# Patient Record
Sex: Female | Born: 1945 | Race: Black or African American | Hispanic: No | Marital: Married | Smoking: Never smoker
Health system: Southern US, Community
[De-identification: ages and names within clinical notes are randomized; demographics above are authoritative.]

## PROBLEM LIST (undated history)

## (undated) DIAGNOSIS — H269 Unspecified cataract: Secondary | ICD-10-CM

## (undated) DIAGNOSIS — M810 Age-related osteoporosis without current pathological fracture: Secondary | ICD-10-CM

## (undated) DIAGNOSIS — D649 Anemia, unspecified: Secondary | ICD-10-CM

## (undated) DIAGNOSIS — M1990 Unspecified osteoarthritis, unspecified site: Secondary | ICD-10-CM

## (undated) DIAGNOSIS — E785 Hyperlipidemia, unspecified: Secondary | ICD-10-CM

## (undated) DIAGNOSIS — I10 Essential (primary) hypertension: Secondary | ICD-10-CM

## (undated) HISTORY — PX: ROTATOR CUFF REPAIR: SHX139

## (undated) HISTORY — DX: Unspecified osteoarthritis, unspecified site: M19.90

## (undated) HISTORY — DX: Essential (primary) hypertension: I10

## (undated) HISTORY — DX: Anemia, unspecified: D64.9

## (undated) HISTORY — DX: Hyperlipidemia, unspecified: E78.5

## (undated) HISTORY — PX: COLONOSCOPY: SHX174

## (undated) HISTORY — PX: POLYPECTOMY: SHX149

## (undated) HISTORY — PX: CATARACT EXTRACTION: SUR2

## (undated) HISTORY — DX: Unspecified cataract: H26.9

## (undated) HISTORY — DX: Age-related osteoporosis without current pathological fracture: M81.0

---

## 2012-05-15 ENCOUNTER — Encounter: Payer: Self-pay | Admitting: Family Medicine

## 2012-05-15 ENCOUNTER — Ambulatory Visit (INDEPENDENT_AMBULATORY_CARE_PROVIDER_SITE_OTHER): Payer: BC Managed Care – PPO | Admitting: Family Medicine

## 2012-05-15 VITALS — BP 158/93 | HR 63 | Ht 63.0 in | Wt 185.0 lb

## 2012-05-15 DIAGNOSIS — I10 Essential (primary) hypertension: Secondary | ICD-10-CM | POA: Insufficient documentation

## 2012-05-15 DIAGNOSIS — Z23 Encounter for immunization: Secondary | ICD-10-CM

## 2012-05-15 DIAGNOSIS — R030 Elevated blood-pressure reading, without diagnosis of hypertension: Secondary | ICD-10-CM

## 2012-05-15 NOTE — Progress Notes (Signed)
  Subjective:    Patient ID: Claudia [name], female    DOB: 10-Oct-1945, 66 y.o.   MRN: [id number]  HPI History was obtained. Patient's here with only one significant medical concern which is her elevated blood pressure. She has been on lisinopril before when her blood pressure went significantly lower she stopped it about her blood pressure to be fine. Blood pressure is up today but this is the first time to this office and first exposures to me.   Review of Systems  All other systems reviewed and are negative.      BP 158/93  Pulse 63  Ht 5\' 3"  (1.6 m)  Wt 185 lb (83.915 kg)  BMI 32.77 kg/m2  SpO2 94% Objective:   Physical Exam  Vitals reviewed. Constitutional: She is oriented to person, place, and time. She appears well-developed.  HENT:  Head: Normocephalic.  Cardiovascular: Normal rate and regular rhythm.   Pulmonary/Chest: Effort normal and breath sounds normal.  Neurological: She is alert and oriented to person, place, and time.  Skin: Skin is warm.  Psychiatric: She has a normal mood and affect. Her behavior is normal.     2nd set of  Assessment & Plan:  #1 elevated blood pressure. We'll recheck her blood pressure before she leaves today. Since her blood pressure has been normal she has been off her lisinopril I am suspicious that this may be white coat hypertension. I've asked her to keep a log of her blood pressure taking it weekly if possible and bring it in to review. Also we will recheck her blood pressure while she is still here today.  #2 flu vaccination. Patient needs flu injection will minister today.  Return in 8 weeks to 10 weeks for complete physical.

## 2012-05-15 NOTE — Patient Instructions (Addendum)
Hypertension As your heart beats, it forces blood through your arteries. This force is your blood pressure. If the pressure is too high, it is called hypertension (HTN) or high blood pressure. HTN is dangerous because you may have it and not know it. High blood pressure may mean that your heart has to work harder to pump blood. Your arteries may be narrow or stiff. The extra work puts you at risk for heart disease, stroke, and other problems.  Blood pressure consists of two numbers, a higher number over a lower, 110/72, for example. It is stated as "110 over 72." The ideal is below 120 for the top number (systolic) and under 80 for the bottom (diastolic). Write down your blood pressure today. You should pay close attention to your blood pressure if you have certain conditions such as:  Heart failure.   Prior heart attack.   Diabetes   Chronic kidney disease.   Prior stroke.   Multiple risk factors for heart disease.  To see if you have HTN, your blood pressure should be measured while you are seated with your arm held at the level of the heart. It should be measured at least twice. A one-time elevated blood pressure reading (especially in the Emergency Department) does not mean that you need treatment. There may be conditions in which the blood pressure is different between your right and left arms. It is important to see your caregiver soon for a recheck. Most people have essential hypertension which means that there is not a specific cause. This type of high blood pressure may be lowered by changing lifestyle factors such as:  Stress.   Smoking.   Lack of exercise.   Excessive weight.   Drug/tobacco/alcohol use.   Eating less salt.  Most people do not have symptoms from high blood pressure until it has caused damage to the body. Effective treatment can often prevent, delay or reduce that damage. TREATMENT  When a cause has been identified, treatment for high blood pressure is  directed at the cause. There are a large number of medications to treat HTN. These fall into several categories, and your caregiver will help you select the medicines that are best for you. Medications may have side effects. You should review side effects with your caregiver. If your blood pressure stays high after you have made lifestyle changes or started on medicines,   Your medication(s) may need to be changed.   Other problems may need to be addressed.   Be certain you understand your prescriptions, and know how and when to take your medicine.   Be sure to follow up with your caregiver within the time frame advised (usually within two weeks) to have your blood pressure rechecked and to review your medications.   If you are taking more than one medicine to lower your blood pressure, make sure you know how and at what times they should be taken. Taking two medicines at the same time can result in blood pressure that is too low.  SEEK IMMEDIATE MEDICAL CARE IF:  You develop a severe headache, blurred or changing vision, or confusion.   You have unusual weakness or numbness, or a faint feeling.   You have severe chest or abdominal pain, vomiting, or breathing problems.  MAKE SURE YOU:   Understand these instructions.   Will watch your condition.   Will get help right away if you are not doing well or get worse.  Document Released: 08/15/2005 Document Revised: 08/04/2011 Document Reviewed:   04/04/2008 ExitCare Patient Information 2012 [place], [place].Intranasal H1N1 Influenza (swine flu) Vaccine What is this medicine? INTRANASAL H1N1 INFLUENZA (SWINE FLU) VACCINE (in truh NEY zuhl H1N1 in floo EN zuh (swahyn floo) vak SEEN) helps reduce the risk of getting the pandemic H1N1 flu also known as the swine flu. The vaccine only helps protect you against this one strain of the flu. This vaccine does not help to the reduce the risk of getting other types of flu. You may also need to get the  seasonal influenza virus vaccine. This medicine may be used for other purposes; ask your health care provider or pharmacist if you have questions. What should I tell my health care provider before I take this medicine? They need to know if you have any of these conditions: -asthma or wheezing -Guillain-Barre syndrome -immune system problems -under 18 and taking aspirin -an unusual or allergic reaction to intranasal influenza vaccine, eggs, gentamicin, gelatin, arginine, other medicines, foods, dyes or preservatives -pregnant or trying to get pregnant -breast-feeding How should I use this medicine? This vaccine is for use in the nose. It is given by a health care professional. A copy of Vaccine Information Statements will be given before each vaccination. Read this sheet carefully each time. The sheet may change frequently. Talk to your pediatrician regarding the use of this medicine in children. While this drug may be prescribed for children as young as 2 years for selected conditions, precautions do apply. Overdosage: If you think you've taken too much of this medicine contact a poison control center or emergency room at once. Overdosage: If you think you have taken too much of this medicine contact a poison control center or emergency room at once. NOTE: This medicine is only for you. Do not share this medicine with others. What if I miss a dose? If needed, keep appointments for follow-up (booster) doses as directed. It is important not to miss your dose. Call your doctor or health care professional if you are unable to keep an appointment. What may interact with this medicine? Do not take this medicine with any of the following medications: -anakinra -rilonacept -tumor necrosis factor (TNF) modifiers like adalimumab, etanercept, infliximab, golimumab, or certolizumab This medicine may also interact with the following medications: -aspirin and aspirin-like medicines -medicines for organ  transplant -medicines to treat cancer -medicines to treat the flu -other vaccines -steroid medicines like prednisone or cortisone This list may not describe all possible interactions. Give your health care provider a list of all the medicines, herbs, non-prescription drugs, or dietary supplements you use. Also tell them if you smoke, drink alcohol, or use illegal drugs. Some items may interact with your medicine. What should I watch for while using this medicine? Report any side effects to your doctor right away. After receiving this vaccine, stay away from people who have severe immune system problems for 7 days. You may give them the flu. This vaccine lowers your risk of getting the pandemic H1N1 flu. You can get a milder H1N1 flu infection if you are around others with this flu. This flu vaccine will not protect against colds or other illnesses including other flu viruses. You may also need the seasonal influenza vaccine. What side effects may I notice from receiving this medicine? Side effects that you should report to your doctor or health care professional as soon as possible: -allergic reactions like skin rash, itching or hives, swelling of the face, lips, or tongue -breathing problems -muscle weakness -unusual drooping or paralysis of  face Side effects that usually do not require medical attention (report to your doctor or health care professional if they continue or are bothersome): -chills -cough -headache -muscle aches and pains -runny or stuffy nose -sore throat -stomach upset -tiredness This list may not describe all possible side effects. Call your doctor for medical advice about side effects. You may report side effects to FDA at 1-800-FDA-1088. Where should I keep my medicine? This vaccine is only given in a clinic, pharmacy, doctor's office, or other health care setting and will not be stored at home. NOTE: This sheet is a summary. It may not cover all possible  information. If you have questions about this medicine, talk to your doctor, pharmacist, or health care provider.  2012, [employer]. (07/15/2008 4:54:09 PM)

## 2012-07-24 ENCOUNTER — Encounter: Payer: Self-pay | Admitting: Family Medicine

## 2012-07-24 ENCOUNTER — Ambulatory Visit (INDEPENDENT_AMBULATORY_CARE_PROVIDER_SITE_OTHER): Payer: BC Managed Care – PPO | Admitting: Family Medicine

## 2012-07-24 VITALS — BP 145/83 | HR 70 | Ht 63.0 in | Wt 185.0 lb

## 2012-07-24 DIAGNOSIS — R590 Localized enlarged lymph nodes: Secondary | ICD-10-CM

## 2012-07-24 DIAGNOSIS — R599 Enlarged lymph nodes, unspecified: Secondary | ICD-10-CM

## 2012-07-24 DIAGNOSIS — IMO0001 Reserved for inherently not codable concepts without codable children: Secondary | ICD-10-CM

## 2012-07-24 DIAGNOSIS — Z1211 Encounter for screening for malignant neoplasm of colon: Secondary | ICD-10-CM

## 2012-07-24 DIAGNOSIS — Z0000 Encounter for general adult medical examination without abnormal findings: Secondary | ICD-10-CM

## 2012-07-24 DIAGNOSIS — R030 Elevated blood-pressure reading, without diagnosis of hypertension: Secondary | ICD-10-CM

## 2012-07-24 LAB — POCT URINALYSIS DIPSTICK
Bilirubin, UA: NEGATIVE
Glucose, UA: NEGATIVE
Leukocytes, UA: NEGATIVE
Nitrite, UA: NEGATIVE

## 2012-07-24 MED ORDER — MINOCYCLINE HCL 100 MG PO CAPS: 100.0000 mg | ORAL_CAPSULE | Freq: Two times a day (BID) | ORAL | Status: DC

## 2012-07-24 NOTE — Progress Notes (Signed)
Subjective:    Patient ID: Claudia [name], female    DOB: 05/09/1946, 66 y.o.   MRN: [id number]  HPI  Yearly exam.  Review of Systems  HENT:       Patient also reports that her adenoids appear swollen and she has noticed increase snoring at night. She is unsure when or how long her symptoms have been going on.  All other systems reviewed and are negative.   No Known Allergies History   Social History  . Marital Status: Married    Spouse Name: N/A    Number of Children: N/A  . Years of Education: N/A   Occupational History  . Not on file.   Social History Main Topics  . Smoking status: Never Smoker   . Smokeless tobacco: Never Used  . Alcohol Use: No  . Drug Use: No  . Sexually Active: Yes    Birth Control/ Protection: Post-menopausal   Other Topics Concern  . Not on file   Social History Narrative  . No narrative on file   Family History  Problem Relation Age of Onset  . Hypertension Mother   . Stroke Father    History reviewed. No pertinent past medical history. Past Surgical History  Procedure Date  . Cataract extraction   . Rotator cuff repair     right      BP 145/83  Pulse 70  Ht 5\' 3"  (1.6 m)  Wt 185 lb (83.915 kg)  BMI 32.77 kg/m2 Objective:   Physical Exam  Vitals reviewed. Constitutional: She is oriented to person, place, and time. She appears well-developed and well-nourished.  HENT:  Head: Normocephalic and atraumatic.  Right Ear: Hearing, tympanic membrane and external ear normal.  Left Ear: Hearing, tympanic membrane and external ear normal.  Nose: No mucosal edema or rhinorrhea.  Mouth/Throat: She does not have dentures. Normal dentition. No lacerations or dental caries.       Bilateral adenopathy present, but thyroid gland WNL on palpation.  Eyes: Conjunctivae normal are normal. Pupils are equal, round, and reactive to light. Right eye exhibits no discharge. Left eye exhibits no discharge.  Fundoscopic exam:      The right eye shows no  arteriolar narrowing, no AV nicking and no exudate.       The left eye shows no red reflex. Neck: Trachea normal and normal range of motion. Neck supple. No JVD present. Carotid bruit is not present. No tracheal deviation present. No mass and no thyromegaly present.       Bilateral adenopathy was present.  Cardiovascular: Normal rate, regular rhythm and normal heart sounds.  Exam reveals no gallop.   No murmur heard. Pulmonary/Chest: Effort normal and breath sounds normal. No respiratory distress.  Abdominal: Soft. Bowel sounds are normal. She exhibits no distension. There is no hepatosplenomegaly, splenomegaly or hepatomegaly. There is no tenderness. There is no rebound and no CVA tenderness. No hernia. Hernia confirmed negative in the ventral area, confirmed negative in the right inguinal area and confirmed negative in the left inguinal area.  Genitourinary: Rectum normal, vagina normal and uterus normal. Rectal exam shows no fissure, no mass, no tenderness and anal tone normal. Guaiac negative stool. No breast swelling, tenderness, discharge or bleeding. There is no rash, tenderness or lesion on the right labia. There is no rash, tenderness or lesion on the left labia. Cervix exhibits no motion tenderness and no discharge. Right adnexum displays no mass and no tenderness. Left adnexum displays no mass and no tenderness.  Musculoskeletal: Normal range of motion. She exhibits no edema and no tenderness.  Lymphadenopathy:    She has cervical adenopathy.  Neurological: She is alert and oriented to person, place, and time. She has normal reflexes.  Skin: Skin is warm and dry.       Several keloids were present.  Psychiatric: She has a normal mood and affect. Her behavior is normal. Thought content normal.    EKG WNL  Results for orders placed in visit on 07/24/12  POCT URINALYSIS DIPSTICK      Component Value Range   Color, UA yellow     Clarity, UA clear     Glucose, UA neg     Bilirubin, UA  neg     Ketones, UA neg     Spec Grav, UA 1.020     Blood, UA small     pH, UA 7.0     Protein, UA neg     Urobilinogen, UA 0.2     Nitrite, UA neg     Leukocytes, UA Negative        Assessment & Plan:  #1 Health maintenance. Will obtain mammogram bone density and routine lab work.  #2 elevated blood pressure we'll continue to watch this may need to consider medication in the future.  #3 adenopathy. Etiology is not clear. We'll check white count also placed on doxycycline/minocycline 100 mg one tablet twice a day for 10 days. If lymph nodes and not improve at a white count of 20,000 within make an ENT referral for further evaluation possible biopsy. Asked patient to be know in 2 weeks how she is doing.   Patient informed of providers departure from this practice in the future and recommends return in 6 months for followup.Marland Kitchen

## 2012-07-24 NOTE — Patient Instructions (Addendum)
Bone Densitometry Bone densitometry is a special X-ray that measures your bone density and can be used to help predict your risk of bone fractures. This test is used to determine bone mineral content and density to diagnose osteoporosis. Osteoporosis is the loss of bone that may cause the bone to become weak. Osteoporosis commonly occurs in women entering menopause. However, it may be found in men and in people with other diseases. PREPARATION FOR TEST No preparation necessary. WHO SHOULD BE TESTED?  All women older than 69.  Postmenopausal women (50 to 79) with risk factors for osteoporosis.  People with a previous fracture caused by normal activities.  People with a small body frame (less than 127 poundsor a body mass index [BMI] of less than 21).  People who have a parent with a hip fracture or history of osteoporosis.  People who smoke.  People who have rheumatoid arthritis.  Anyone who engages in excessive alcohol use (more than 3 drinks most days).  Women who experience early menopause. WHEN SHOULD YOU BE RETESTED? Current guidelines suggest that you should wait at least 2 years before doing a bone density test again if your first test was normal.Recent studies indicated that women with normal bone density may be able to wait a few years before needing to repeat a bone density test. You should discuss this with your caregiver.  NORMAL FINDINGS   Normal: less than standard deviation below normal (greater than -1).  Osteopenia: 1 to 2.5 standard deviations below normal (-1 to -2.5).  Osteoporosis: greater than 2.5 standard deviations below normal (less than -2.5). Test results are reported as a "T score" and a "Z score."The T score is a number that compares your bone density with the bone density of healthy, young women.The Z score is a number that compares your bone density with the scores of women who are the same age, gender, and race.  Ranges for normal findings may vary  among different laboratories and hospitals. You should always check with your doctor after having lab work or other tests done to discuss the meaning of your test results and whether your values are considered within normal limits. MEANING OF TEST  Your caregiver will go over the test results with you and discuss the importance and meaning of your results, as well as treatment options and the need for additional tests if necessary. OBTAINING THE TEST RESULTS It is your responsibility to obtain your test results. Ask the lab or department performing the test when and how you will get your results. Document Released: 09/06/2004 Document Revised: 11/07/2011 Document Reviewed: 09/29/2010 [facility] Patient Information 2013 [place], [place]. Blood Pressure Record Sheet Your blood pressure on this visit to the Emergency Department or Clinic is elevated. This does not necessarily mean you have hypertension, but it does mean that your blood pressure needs to be rechecked. Many times blood pressure increases because of illness, pain, anxiety, or other factors. We recommend that you get a series of blood pressure readings done over a period of about 5 days. It is best to get a reading in the morning and one in the evening. You should make sure you sit and relax for 1 to 5 minutes before the reading is taken. Write the readings down and make a follow-up appointment with your doctor to discuss the results. If there is not a free clinic or a drug store with blood pressure taking machines near you, you can purchase good blood pressure taking equipment from a drug store,  often for less than the price of a physician's office call. Having one in the home also allows you the convenience of taking your blood pressure while you are home and in relaxed surroundings. Your blood pressure in the Emergency Department or Clinic on ________ was ____________________. BLOOD PRESSURE LOG Date: _______________________  a.m.  _____________________  p.m. _____________________ Date: _______________________  a.m. _____________________  p.m. _____________________ Date: _______________________  a.m. _____________________  p.m. _____________________ Date: _______________________  a.m. _____________________  p.m. _____________________ Date: _______________________  a.m. _____________________  p.m. _____________________ Document Released: 05/14/2003 Document Revised: 11/07/2011 Document Reviewed: 08/15/2005 ExitCare Patient Information 2013 [place], [place].

## 2012-07-25 ENCOUNTER — Other Ambulatory Visit (HOSPITAL_COMMUNITY)
Admission: RE | Admit: 2012-07-25 | Discharge: 2012-07-25 | Disposition: A | Discharge status: Home / Self Care | Payer: BC Managed Care – PPO | Source: Ambulatory Visit | Attending: Family Medicine | Admitting: Family Medicine

## 2012-07-25 ENCOUNTER — Encounter: Payer: Self-pay | Admitting: *Deleted

## 2012-07-25 DIAGNOSIS — Z01419 Encounter for gynecological examination (general) (routine) without abnormal findings: Secondary | ICD-10-CM | POA: Insufficient documentation

## 2012-07-25 LAB — HEMOCCULT GUIAC POC 1CARD (OFFICE): Fecal Occult Blood, POC: NEGATIVE

## 2012-07-25 NOTE — Addendum Note (Signed)
Addended by: Ellsworth Lennox on: 07/25/2012 08:27 AM   Modules accepted: Orders

## 2012-08-06 ENCOUNTER — Ambulatory Visit: Payer: BC Managed Care – PPO

## 2012-08-06 ENCOUNTER — Ambulatory Visit (INDEPENDENT_AMBULATORY_CARE_PROVIDER_SITE_OTHER): Payer: BC Managed Care – PPO

## 2012-08-06 DIAGNOSIS — Z0000 Encounter for general adult medical examination without abnormal findings: Secondary | ICD-10-CM

## 2012-08-06 DIAGNOSIS — Z1231 Encounter for screening mammogram for malignant neoplasm of breast: Secondary | ICD-10-CM

## 2012-08-06 DIAGNOSIS — M899 Disorder of bone, unspecified: Secondary | ICD-10-CM

## 2012-08-07 ENCOUNTER — Encounter: Payer: Self-pay | Admitting: Family Medicine

## 2012-08-07 ENCOUNTER — Other Ambulatory Visit: Payer: BC Managed Care – PPO

## 2012-08-07 ENCOUNTER — Ambulatory Visit: Payer: BC Managed Care – PPO

## 2012-08-07 LAB — CBC WITH DIFFERENTIAL/PLATELET
Basophils Absolute: 0 10*3/uL (ref 0.0–0.1)
Basophils Relative: 1 % (ref 0–1)
MCHC: 32.5 g/dL (ref 30.0–36.0)
Monocytes Absolute: 0.3 10*3/uL (ref 0.1–1.0)
Neutro Abs: 3.1 10*3/uL (ref 1.7–7.7)
Neutrophils Relative %: 56 % (ref 43–77)
RDW: 14.7 % (ref 11.5–15.5)

## 2012-08-07 LAB — LIPID PANEL
HDL: 82 mg/dL (ref >39)
LDL Cholesterol: 153 mg/dL — ABNORMAL HIGH (ref 0–99)

## 2012-08-07 LAB — HEMOGLOBIN A1C: Hgb A1c MFr Bld: 5.7 % — ABNORMAL HIGH (ref <5.7)

## 2012-08-07 LAB — COMPREHENSIVE METABOLIC PANEL
ALT: 18 U/L (ref 0–35)
AST: 28 U/L (ref 0–37)
Albumin: 4.2 g/dL (ref 3.5–5.2)
Calcium: 9.3 mg/dL (ref 8.4–10.5)
Chloride: 101 mEq/L (ref 96–112)
Potassium: 3.5 mEq/L (ref 3.5–5.3)
Total Protein: 7.3 g/dL (ref 6.0–8.3)

## 2012-08-07 LAB — TSH: TSH: 0.929 u[IU]/mL (ref 0.350–4.500)

## 2012-08-07 MED ORDER — RALOXIFENE HCL 60 MG PO TABS: 60.0000 mg | ORAL_TABLET | Freq: Every day | ORAL | Status: AC

## 2012-08-07 MED ORDER — RALOXIFENE HCL 60 MG PO TABS: 60.0000 mg | ORAL_TABLET | Freq: Every day | ORAL | Status: DC

## 2013-06-10 ENCOUNTER — Ambulatory Visit: Payer: Self-pay | Admitting: Family Medicine

## 2013-10-13 LAB — CBC AND DIFFERENTIAL
HEMOGLOBIN: 12.7 g/dL (ref 12.0–16.0)
Platelets: 299 10*3/uL (ref 150–399)
WBC: 8.1 10^3/mL

## 2013-10-13 LAB — BASIC METABOLIC PANEL
BUN: 17 mg/dL (ref 4–21)
Creatinine: 0.6 mg/dL (ref 0.5–1.1)
Glucose: 113 mg/dL
Potassium: 3.2 mmol/L — AB (ref 3.4–5.3)
Sodium: 137 mmol/L (ref 137–147)

## 2013-10-13 LAB — HEPATIC FUNCTION PANEL
ALK PHOS: 90 U/L (ref 25–125)
ALT: 29 U/L (ref 7–35)
AST: 44 U/L — AB (ref 13–35)
BILIRUBIN, TOTAL: 1 mg/dL

## 2013-11-08 ENCOUNTER — Ambulatory Visit (INDEPENDENT_AMBULATORY_CARE_PROVIDER_SITE_OTHER): Payer: BC Managed Care – PPO | Admitting: Family Medicine

## 2013-11-08 ENCOUNTER — Encounter: Payer: Self-pay | Admitting: Family Medicine

## 2013-11-08 VITALS — BP 128/73 | HR 60 | Temp 98.1°F | Ht 63.0 in | Wt 180.0 lb

## 2013-11-08 DIAGNOSIS — R0789 Other chest pain: Secondary | ICD-10-CM

## 2013-11-08 DIAGNOSIS — A059 Bacterial foodborne intoxication, unspecified: Secondary | ICD-10-CM

## 2013-11-08 DIAGNOSIS — F458 Other somatoform disorders: Secondary | ICD-10-CM

## 2013-11-08 DIAGNOSIS — R0989 Other specified symptoms and signs involving the circulatory and respiratory systems: Secondary | ICD-10-CM

## 2013-11-08 DIAGNOSIS — E785 Hyperlipidemia, unspecified: Secondary | ICD-10-CM | POA: Insufficient documentation

## 2013-11-08 DIAGNOSIS — R7989 Other specified abnormal findings of blood chemistry: Secondary | ICD-10-CM

## 2013-11-08 HISTORY — DX: Hyperlipidemia, unspecified: E78.5

## 2013-11-08 LAB — COMPLETE METABOLIC PANEL WITH GFR
ALT: 23 U/L (ref 0–35)
AST: 31 U/L (ref 0–37)
Albumin: 4.3 g/dL (ref 3.5–5.2)
Alkaline Phosphatase: 86 U/L (ref 39–117)
BUN: 13 mg/dL (ref 6–23)
CALCIUM: 10 mg/dL (ref 8.4–10.5)
CHLORIDE: 100 meq/L (ref 96–112)
CO2: 29 mEq/L (ref 19–32)
CREATININE: 0.68 mg/dL (ref 0.50–1.10)
GFR, Est African American: >89 mL/min
GFR, Est Non African American: >89 mL/min
Glucose, Bld: 78 mg/dL (ref 70–99)
POTASSIUM: 3.5 meq/L (ref 3.5–5.3)
Sodium: 139 mEq/L (ref 135–145)
Total Bilirubin: 0.4 mg/dL (ref 0.2–1.2)
Total Protein: 7.2 g/dL (ref 6.0–8.3)

## 2013-11-08 LAB — LIPID PANEL
Cholesterol: 208 mg/dL — ABNORMAL HIGH (ref 0–200)
HDL: 76 mg/dL (ref >39)
LDL CALC: 123 mg/dL — AB (ref 0–99)
TRIGLYCERIDES: 43 mg/dL (ref <150)
Total CHOL/HDL Ratio: 2.7 Ratio
VLDL: 9 mg/dL (ref 0–40)

## 2013-11-08 NOTE — Progress Notes (Signed)
Subjective:    Patient ID: Claudia [name], female    DOB: 04/01/1946, 68 y.o.   MRN: [id number]  HPI Here to followup evaluation. She [facility] in [place]. Her presenting symptoms were vomiting and diarrhea. She did notice some blood in the stool. She also complained of feeling weak dizzy and lightheaded. She had a fever over 101 for more than 3 days at that time. She had some left upper outer chest pain. They did do a white blood cell count on her that was fairly normal except for an increase in neutrophils and lymphocytes but overall white blood cell count was 8000. Troponin was negative. AST was slightly elevated at 44. Lipase was normal. CK and CK-MB were normal. Potassium which is slightly low at 3.2 which is unusual with vomiting and diarrhea. Chloride was low at 99. Urinalysis showed rare bacteria. With greater than 15 hyaline casts and some mucous in the urine. They did an EKG as well which showed a rate of 67 beats per minute with normal sinus rhythm with inverted T waves in the lateral leads.  Had eaten at a food bar while at a conference for work in [place].  She started having vomiting and diarrhea and then went to UC and was dx with food poisoining.  She has had some left sided CP radiating down into her left arm that lasted about 10 min. this has occurred several times. Does not seem to be related to eating or not eating. Though she says occasionally if she belches it actually feels better. She's not sure if it could be a gas pain. Occurred while laying down to sleep.  Was still able to go to sleep.   No CP when she works out on the treadmill .  She sleeps on one pillow and no SOB.  No diaphoresis with the episodes. No pain radiating up into the jaw.  She's also noticed in the last few months that when she lays down at night she feels that there something blocking her throat. She denies any recent heartburn or reflux symptoms. She says that she  tries to swallow it actually seems to feel better. No known history of sleep apnea. Review of Systems  BP 128/73  Pulse 60  Temp(Src) 98.1 F (36.7 C)  Ht 5\' 3"  (1.6 m)  Wt 180 lb (81.647 kg)  BMI 31.89 kg/m2  SpO2 98%    No Known Allergies  Past Medical History  Diagnosis Date  . Other and unspecified hyperlipidemia 11/08/2013    Past Surgical History  Procedure Laterality Date  . Cataract extraction    . Rotator cuff repair      right    History   Social History  . Marital Status: Married    Spouse Name: N/A    Number of Children: N/A  . Years of Education: N/A   Occupational History  . Not on file.   Social History Main Topics  . Smoking status: Never Smoker   . Smokeless tobacco: Never Used  . Alcohol Use: No  . Drug Use: No  . Sexual Activity: Yes    Birth Control/ Protection: Post-menopausal   Other Topics Concern  . Not on file   Social History Narrative  . No narrative on file    Family History  Problem Relation Age of Onset  . Hypertension Mother   . Stroke Father     Outpatient Encounter Prescriptions as of 11/08/2013  Medication Sig  .  AMBULATORY NON FORMULARY MEDICATION Take 1 capsule by mouth daily. Medication Name: Availyn  . AMBULATORY NON FORMULARY MEDICATION Take 2 capsules by mouth daily. Medication Name: Omax 3  . [DISCONTINUED] minocycline (MINOCIN,DYNACIN) 100 MG capsule Take 1 capsule (100 mg total) by mouth 2 (two) times daily.          Objective:   Physical Exam  Constitutional: She is oriented to person, place, and time. She appears well-developed and well-nourished.  HENT:  Head: Normocephalic and atraumatic.  Neck: Neck supple. No thyromegaly present.  Cardiovascular: Normal rate, regular rhythm and normal heart sounds.   No carotid or abdominal bruits.  Pulmonary/Chest: Effort normal and breath sounds normal.  Abdominal: Soft. Bowel sounds are normal. She exhibits no distension and no mass. There is no tenderness.  There is no rebound and no guarding.  Lymphadenopathy:    She has no cervical adenopathy.  Neurological: She is alert and oriented to person, place, and time.  Skin: Skin is warm and dry.  Psychiatric: She has a normal mood and affect. Her behavior is normal.          Assessment & Plan:  Atypical chest pain-still unclear etiology. Overall she's fairly low risk except for elevated cholesterol. She has normal blood pressure. No family history of heart disease. She is not a smoker and has never been a smoker. She is not diabetic. The am going to go ahead and refer her to cardiology for further evaluation. He EKG today shows 58 beats per minute, normal sinus rhythm with no acute changes. There still inverted T waves in lead V1 V2 and V3.  Food poisoning-  Repeat UA as she did have bacteria on the urinalysis. The she was not treated for UTI. Also check CMP ot rehceck potassium and liver enzymes    Globus sensation primarily at night when trying to go to sleep. Unclear etiology. There is no enlarged or swollen tissue in the posterior pharynx. Her behavior for ENT for further evaluation. No recent onset of heartburn or reflux symptoms that can also be causing this sensation.

## 2013-11-09 LAB — URINALYSIS, ROUTINE W REFLEX MICROSCOPIC
Bilirubin Urine: NEGATIVE
Glucose, UA: NEGATIVE mg/dL
HGB URINE DIPSTICK: NEGATIVE
KETONES UR: NEGATIVE mg/dL
Leukocytes, UA: NEGATIVE
NITRITE: NEGATIVE
PROTEIN: NEGATIVE mg/dL
SPECIFIC GRAVITY, URINE: 1.012 (ref 1.005–1.030)
UROBILINOGEN UA: 0.2 mg/dL (ref 0.0–1.0)
pH: 6.5 (ref 5.0–8.0)

## 2013-11-12 ENCOUNTER — Encounter: Payer: Self-pay | Admitting: *Deleted

## 2013-12-17 ENCOUNTER — Ambulatory Visit (INDEPENDENT_AMBULATORY_CARE_PROVIDER_SITE_OTHER): Payer: BC Managed Care – PPO | Admitting: Family Medicine

## 2013-12-17 DIAGNOSIS — Z23 Encounter for immunization: Secondary | ICD-10-CM

## 2013-12-17 DIAGNOSIS — Z0184 Encounter for antibody response examination: Secondary | ICD-10-CM

## 2013-12-17 NOTE — Progress Notes (Signed)
   Subjective:    Patient ID: Claudia HarmanRuth Matt, female    DOB: 19-Aug-1946, 68 y.o.   MRN: [id number]  HPI    Review of Systems     Objective:   Physical Exam        Assessment & Plan:  Tdap vaccine given left deltoid without complications. Barry DienesKimberly Tineka Uriegas, LPN

## 2013-12-19 LAB — QUANTIFERON TB GOLD ASSAY (BLOOD)
Interferon Gamma Release Assay: POSITIVE — AB
Mitogen value: >10 IU/mL
Quantiferon Nil Value: 0.02 IU/mL
Quantiferon Tb Ag Minus Nil Value: 2.69 IU/mL
TB Ag value: 2.71 IU/mL

## 2013-12-23 ENCOUNTER — Telehealth: Payer: Self-pay | Admitting: Family Medicine

## 2013-12-23 ENCOUNTER — Ambulatory Visit: Payer: BC Managed Care – PPO

## 2013-12-23 DIAGNOSIS — R7612 Nonspecific reaction to cell mediated immunity measurement of gamma interferon antigen response without active tuberculosis: Secondary | ICD-10-CM

## 2013-12-23 DIAGNOSIS — Z0184 Encounter for antibody response examination: Secondary | ICD-10-CM

## 2013-12-23 NOTE — Telephone Encounter (Addendum)
Please call patient: Please tell her that I apologize for misinterpreted her results. 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 Which means she is either had the infection in the past, currently has the infection, or has had the immunization/vaccine. 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 Also a sputum smear and culture.

## 2013-12-23 NOTE — Telephone Encounter (Signed)
Pt states she received BCG as a child. She was also informed to go to the lab and go for CXR for positive TB Quanitium test.  Meyer CoryMisty Ahmad, LPN

## 2013-12-24 ENCOUNTER — Ambulatory Visit (INDEPENDENT_AMBULATORY_CARE_PROVIDER_SITE_OTHER): Payer: BC Managed Care – PPO

## 2013-12-24 ENCOUNTER — Encounter: Payer: Self-pay | Admitting: Family Medicine

## 2013-12-24 ENCOUNTER — Ambulatory Visit (INDEPENDENT_AMBULATORY_CARE_PROVIDER_SITE_OTHER): Payer: BC Managed Care – PPO | Admitting: Family Medicine

## 2013-12-24 VITALS — BP 165/89 | HR 61 | Wt 177.0 lb

## 2013-12-24 DIAGNOSIS — R7611 Nonspecific reaction to tuberculin skin test without active tuberculosis: Secondary | ICD-10-CM

## 2013-12-24 DIAGNOSIS — I10 Essential (primary) hypertension: Secondary | ICD-10-CM

## 2013-12-24 DIAGNOSIS — R7612 Nonspecific reaction to cell mediated immunity measurement of gamma interferon antigen response without active tuberculosis: Secondary | ICD-10-CM

## 2013-12-24 LAB — MUMPS ANTIBODY, IGG: Mumps IgG: 76.1 AU/mL — ABNORMAL HIGH (ref <9.00)

## 2013-12-24 LAB — RUBEOLA ANTIBODY IGG: Rubeola IgG: 84.2 AU/mL — ABNORMAL HIGH (ref <25.00)

## 2013-12-24 NOTE — Patient Instructions (Signed)
DASH Diet  The DASH diet stands for "Dietary Approaches to Stop Hypertension." It is a healthy eating plan that has been shown to reduce high blood pressure (hypertension) in as little as 14 days, while also possibly providing other significant health benefits. These other health benefits include reducing the risk of breast cancer after menopause and reducing the risk of type 2 diabetes, heart disease, colon cancer, and stroke. Health benefits also include weight loss and slowing kidney failure in patients with chronic kidney disease.   DIET GUIDELINES  · Limit salt (sodium). Your diet should contain less than 1500 mg of sodium daily.  · Limit refined or processed carbohydrates. Your diet should include mostly whole grains. Desserts and added sugars should be used sparingly.  · Include small amounts of heart-healthy fats. These types of fats include nuts, oils, and tub margarine. Limit saturated and trans fats. These fats have been shown to be harmful in the body.  CHOOSING FOODS   The following food groups are based on a 2000 calorie diet. See your Registered Dietitian for individual calorie needs.  Grains and Grain Products (6 to 8 servings daily)  · Eat More Often: Whole-wheat bread, brown rice, whole-grain or wheat pasta, quinoa, popcorn without added fat or salt (air popped).  · Eat Less Often: White bread, white pasta, white rice, cornbread.  Vegetables (4 to 5 servings daily)  · Eat More Often: Fresh, frozen, and canned vegetables. Vegetables may be raw, steamed, roasted, or grilled with a minimal amount of fat.  · Eat Less Often/Avoid: Creamed or fried vegetables. Vegetables in a cheese sauce.  Fruit (4 to 5 servings daily)  · Eat More Often: All fresh, canned (in natural juice), or frozen fruits. Dried fruits without added sugar. One hundred percent fruit juice (½ cup [237 mL] daily).  · Eat Less Often: Dried fruits with added sugar. Canned fruit in light or heavy syrup.  Lean Meats, Fish, and Poultry (2  servings or less daily. One serving is 3 to 4 oz [85-114 g]).  · Eat More Often: Ninety percent or leaner ground beef, tenderloin, sirloin. Round cuts of beef, chicken breast, turkey breast. All fish. Grill, bake, or broil your meat. Nothing should be fried.  · Eat Less Often/Avoid: Fatty cuts of meat, turkey, or chicken leg, thigh, or wing. Fried cuts of meat or fish.  Dairy (2 to 3 servings)  · Eat More Often: Low-fat or fat-free milk, low-fat plain or light yogurt, reduced-fat or part-skim cheese.  · Eat Less Often/Avoid: Milk (whole, 2%). Whole milk yogurt. Full-fat cheeses.  Nuts, Seeds, and Legumes (4 to 5 servings per week)  · Eat More Often: All without added salt.  · Eat Less Often/Avoid: Salted nuts and seeds, canned beans with added salt.  Fats and Sweets (limited)  · Eat More Often: Vegetable oils, tub margarines without trans fats, sugar-free gelatin. Mayonnaise and salad dressings.  · Eat Less Often/Avoid: Coconut oils, palm oils, butter, stick margarine, cream, half and half, cookies, candy, pie.  FOR MORE INFORMATION  The Dash Diet Eating Plan: www.dashdiet.org  Document Released: 08/04/2011 Document Revised: 11/07/2011 Document Reviewed: 08/04/2011  ExitCare® Patient Information ©2014 [employer].

## 2013-12-24 NOTE — Progress Notes (Signed)
Subjective:    Patient ID: Claudia [name], female    DOB: 1946-04-28, 68 y.o.   MRN: [id number]  HPI She had BCG in her teens. No exposure to TB. She is low risk.  Doesn't work with high risk population.  She has a positive tine test in the past and tol d had to have a neg CXR every 3 years.  Teaches nursing. First and foremost I apologized to her for incorrectly interpreting her test results for her quantity on gold assay. She's not having any type of cough or productive sputum currently.   BP has been running in the 150-160/90. She has been doing the DASH diet and working out 3 times per week. No CP or SOB or palpitations.  Had a negative stress test. In fact she got the results back this morning. Her blood pressure was recently elevated when she went to see the cardiologist. She's been following it very carefully since then.  Review of Systems     BP 165/89  Pulse 61  Wt 177 lb (80.287 kg)    No Known Allergies  Past Medical History  Diagnosis Date  . Other and unspecified hyperlipidemia 11/08/2013    Past Surgical History  Procedure Laterality Date  . Cataract extraction    . Rotator cuff repair      right    History   Social History  . Marital Status: Married    Spouse Name: N/A    Number of Children: N/A  . Years of Education: N/A   Occupational History  . Not on file.   Social History Main Topics  . Smoking status: Never Smoker   . Smokeless tobacco: Never Used  . Alcohol Use: No  . Drug Use: No  . Sexual Activity: Yes    Birth Control/ Protection: Post-menopausal   Other Topics Concern  . Not on file   Social History Narrative  . No narrative on file    Family History  Problem Relation Age of Onset  . Hypertension Mother   . Stroke Father     Outpatient Encounter Prescriptions as of 12/24/2013  Medication Sig  . AMBULATORY NON FORMULARY MEDICATION Take 1 capsule by mouth daily. Medication Name: Availyn  . AMBULATORY NON FORMULARY MEDICATION Take 2  capsules by mouth daily. Medication Name: Omax 3       Objective:   Physical Exam  Constitutional: She is oriented to person, place, and time. She appears well-developed and well-nourished.  HENT:  Head: Normocephalic and atraumatic.  Cardiovascular: Normal rate, regular rhythm and normal heart sounds.   Pulmonary/Chest: Effort normal and breath sounds normal.  Neurological: She is alert and oriented to person, place, and time.  Skin: Skin is warm and dry.  Psychiatric: She has a normal mood and affect. Her behavior is normal.          Assessment & Plan:  HTN- uncontrolled. New dx.  we discussed and reviewed the DASH diet. Additional information provided. Gave her the website gone down but the 16th history of present illness for further information. She's actually lost a couple pounds over the last few weeks which I think is fantastic. Continue with diet and exercise changes and follow blood pressure of the next 2-3 weeks. If still not well controlled with lifestyle changes then we'll need to start medication. She is definitely open to this and willing to move forward. Followup in 2 weeks. We did check a thyroid level as well.  Positive quantiferon gold-will get chest  x-ray, sputum culture et Karie Sodacetera.

## 2013-12-27 ENCOUNTER — Other Ambulatory Visit: Payer: Self-pay | Admitting: Family Medicine

## 2013-12-27 DIAGNOSIS — R7612 Nonspecific reaction to cell mediated immunity measurement of gamma interferon antigen response without active tuberculosis: Secondary | ICD-10-CM

## 2013-12-27 LAB — RESPIRATORY CULTURE OR RESPIRATORY AND SPUTUM CULTURE
GRAM STAIN: NONE SEEN
Organism ID, Bacteria: NORMAL

## 2013-12-30 LAB — RUBELLA ANTIBODY, IGM: Rubella IgM: 0.22

## 2014-01-15 ENCOUNTER — Encounter: Payer: Self-pay | Admitting: Family Medicine

## 2014-11-11 LAB — BASIC METABOLIC PANEL
Creatinine: 0.7 mg/dL (ref 0.5–1.1)
GLUCOSE: 90 mg/dL
Potassium: 4 mmol/L (ref 3.4–5.3)
SODIUM: 139 mmol/L (ref 137–147)

## 2014-11-11 LAB — HEPATIC FUNCTION PANEL
ALT: 12 U/L (ref 7–35)
AST: 18 U/L (ref 13–35)
Alkaline Phosphatase: 83 U/L (ref 25–125)

## 2014-11-11 LAB — COMPLETE METABOLIC PANEL WITH GFR
CHLORIDE: 104 mmol/L
CO2: 27 mmol/L

## 2014-11-11 LAB — CBC AND DIFFERENTIAL
Hemoglobin: 11.6 g/dL — AB (ref 12.0–16.0)
Platelets: 300 10*3/uL (ref 150–399)
WBC: 5.3 10*3/mL

## 2014-11-11 LAB — HEMOGLOBIN A1C: Hgb A1c MFr Bld: 5.4 % (ref 4.0–6.0)

## 2015-03-17 ENCOUNTER — Telehealth: Payer: Self-pay | Admitting: *Deleted

## 2015-03-17 DIAGNOSIS — Z0184 Encounter for antibody response examination: Secondary | ICD-10-CM

## 2015-03-17 NOTE — Telephone Encounter (Signed)
Spoke with pt and she does not need to be seen by Dr. Linford ArnoldMetheney. She stated that [facility]. This was printed off. Pt advised that she only needs Hep b immunization. She stated that she has had hep b immunization. I told her that she could either do the whole series over or we could do immunity testing. She would like to have the immunity testing done. Lab ordered and faxed.Loralee PacasBarkley, Aleda Madl NappaneeLynetta

## 2015-03-18 ENCOUNTER — Ambulatory Visit: Payer: BC Managed Care – PPO | Admitting: Family Medicine

## 2015-03-19 LAB — HEPATITIS B SURFACE ANTIBODY, QUANTITATIVE: HEPATITIS B-POST: 1000 m[IU]/mL

## 2015-03-24 ENCOUNTER — Encounter: Payer: Self-pay | Admitting: Family Medicine

## 2015-04-01 ENCOUNTER — Telehealth: Payer: Self-pay | Admitting: *Deleted

## 2015-04-07 ENCOUNTER — Encounter: Payer: BC Managed Care – PPO | Admitting: Family Medicine

## 2015-05-19 ENCOUNTER — Telehealth: Payer: Self-pay | Admitting: Family Medicine

## 2015-05-19 DIAGNOSIS — Z1231 Encounter for screening mammogram for malignant neoplasm of breast: Secondary | ICD-10-CM

## 2015-05-19 NOTE — Telephone Encounter (Signed)
Pt came in this after noon 05/18/18 and is interested in getting a mammogram. Thanks

## 2015-05-20 NOTE — Telephone Encounter (Signed)
Order placed.[name], Claudia Lynetta  

## 2015-05-29 NOTE — Telephone Encounter (Signed)
Pt informed that she does not need another tb titer.Claudia [name]

## 2015-06-17 ENCOUNTER — Ambulatory Visit (INDEPENDENT_AMBULATORY_CARE_PROVIDER_SITE_OTHER): Payer: BC Managed Care – PPO

## 2015-06-17 DIAGNOSIS — Z1231 Encounter for screening mammogram for malignant neoplasm of breast: Secondary | ICD-10-CM

## 2015-06-25 ENCOUNTER — Encounter: Payer: Self-pay | Admitting: Family Medicine

## 2015-06-25 DIAGNOSIS — M8580 Other specified disorders of bone density and structure, unspecified site: Secondary | ICD-10-CM

## 2016-06-13 ENCOUNTER — Encounter: Payer: Self-pay | Admitting: Family Medicine

## 2016-06-13 ENCOUNTER — Ambulatory Visit (INDEPENDENT_AMBULATORY_CARE_PROVIDER_SITE_OTHER): Payer: BC Managed Care – PPO | Admitting: Family Medicine

## 2016-06-13 ENCOUNTER — Other Ambulatory Visit: Payer: Self-pay | Admitting: Family Medicine

## 2016-06-13 VITALS — BP 166/70 | HR 59 | Ht 63.0 in | Wt 188.0 lb

## 2016-06-13 DIAGNOSIS — I10 Essential (primary) hypertension: Secondary | ICD-10-CM

## 2016-06-13 DIAGNOSIS — M8589 Other specified disorders of bone density and structure, multiple sites: Secondary | ICD-10-CM | POA: Diagnosis not present

## 2016-06-13 DIAGNOSIS — Z1231 Encounter for screening mammogram for malignant neoplasm of breast: Secondary | ICD-10-CM

## 2016-06-13 DIAGNOSIS — Z0000 Encounter for general adult medical examination without abnormal findings: Secondary | ICD-10-CM | POA: Diagnosis not present

## 2016-06-13 MED ORDER — LISINOPRIL-HYDROCHLOROTHIAZIDE 10-12.5 MG PO TABS: 1.0000 | ORAL_TABLET | Freq: Every day | ORAL | 1 refills | Status: DC

## 2016-06-13 NOTE — Progress Notes (Signed)
   Subjective:     Claudia [name] is a 70 y.o. female and is here for a comprehensive physical exam. The patient reports no problems.  She is exercising 2-3 days per week.   Social History   Social History  . Marital status: Married    Spouse name: N/A  . Number of children: N/A  . Years of education: N/A   Occupational History  . Not on file.   Social History Main Topics  . Smoking status: Never Smoker  . Smokeless tobacco: Never Used  . Alcohol use No  . Drug use: No  . Sexual activity: Yes    Birth control/ protection: Post-menopausal   Other Topics Concern  . Not on file   Social History Narrative  . No narrative on file   Health Maintenance  Topic Date Due  . Hepatitis C Screening  April 14, 1946  . PNA vac Low Risk Adult (1 of 2 - PCV13) 08/22/2012  . ZOSTAVAX  07/24/2021 (Originally 08/23/2007)  . MAMMOGRAM  06/16/2017  . COLONOSCOPY  06/29/2017  . TETANUS/TDAP  12/18/2023  . INFLUENZA VACCINE  Completed  . DEXA SCAN  Completed    The following portions of the patient's history were reviewed and updated as appropriate: allergies, current medications, past family history, past medical history, past social history, past surgical history and problem list.  Review of Systems A comprehensive review of systems was negative.   Objective:    BP (!) 166/70   Pulse (!) 59   Ht 5\' 3"  (1.6 m)   Wt 188 lb (85.3 kg)   BMI 33.30 kg/m  General appearance: alert, cooperative and appears stated age Head: Normocephalic, without obvious abnormality, atraumatic Eyes: conj clear, EOMI, PEERLA Ears: normal TM's and external ear canals both ears Nose: Nares normal. Septum midline. Mucosa normal. No drainage or sinus tenderness. Throat: lips, mucosa, and tongue normal; teeth and gums normal Neck: no adenopathy, no carotid bruit, no JVD, supple, symmetrical, trachea midline and thyroid not enlarged, symmetric, no tenderness/mass/nodules Back: symmetric, no curvature. ROM normal. No  CVA tenderness. Lungs: clear to auscultation bilaterally Breasts: normal appearance, no masses or tenderness Heart: regular rate and rhythm, S1, S2 normal, no murmur, click, rub or gallop Abdomen: soft, non-tender; bowel sounds normal; no masses,  no organomegaly Extremities: extremities normal, atraumatic, no cyanosis or edema Pulses: 2+ and symmetric Skin: Skin color, texture, turgor normal. No rashes or lesions Lymph nodes: Cervical, supraclavicular, and axillary nodes normal. Neurologic: Alert and oriented X 3, normal strength and tone. Normal symmetric reflexes. Normal coordination and gait    Assessment:    Healthy female exam.      Plan:     See After Visit Summary for Counseling Recommendations   Keep up a regular exercise program and make sure you are eating a healthy diet Try to eat 4 servings of dairy a day, or if you are lactose intolerant take a calcium with vitamin D daily.  Your vaccines are up to date.   HTN - New Dx. Start with lisinoprilHCT. Follow-up in 2 weeks for blood pressure check as well as a BMP.  She also wants to follow her BPs at home as well.

## 2016-07-01 ENCOUNTER — Ambulatory Visit (INDEPENDENT_AMBULATORY_CARE_PROVIDER_SITE_OTHER): Payer: BC Managed Care – PPO

## 2016-07-01 ENCOUNTER — Ambulatory Visit: Payer: BC Managed Care – PPO

## 2016-07-01 ENCOUNTER — Ambulatory Visit (INDEPENDENT_AMBULATORY_CARE_PROVIDER_SITE_OTHER): Payer: BC Managed Care – PPO | Admitting: Family Medicine

## 2016-07-01 ENCOUNTER — Other Ambulatory Visit: Payer: Self-pay | Admitting: Family Medicine

## 2016-07-01 VITALS — BP 146/76 | HR 59

## 2016-07-01 DIAGNOSIS — M8588 Other specified disorders of bone density and structure, other site: Secondary | ICD-10-CM | POA: Diagnosis not present

## 2016-07-01 DIAGNOSIS — I10 Essential (primary) hypertension: Secondary | ICD-10-CM | POA: Diagnosis not present

## 2016-07-01 DIAGNOSIS — M8589 Other specified disorders of bone density and structure, multiple sites: Secondary | ICD-10-CM | POA: Insufficient documentation

## 2016-07-01 LAB — CBC WITH DIFFERENTIAL/PLATELET
BASOS ABS: 44 {cells}/uL (ref 0–200)
Basophils Relative: 1 %
EOS PCT: 6 %
Eosinophils Absolute: 264 cells/uL (ref 15–500)
HCT: 36.6 % (ref 35.0–45.0)
Hemoglobin: 11.8 g/dL (ref 11.7–15.5)
Lymphocytes Relative: 36 %
Lymphs Abs: 1584 cells/uL (ref 850–3900)
MCH: 27.3 pg (ref 27.0–33.0)
MCHC: 32.2 g/dL (ref 32.0–36.0)
MCV: 84.5 fL (ref 80.0–100.0)
MONOS PCT: 7 %
MPV: 9.1 fL (ref 7.5–12.5)
Monocytes Absolute: 308 cells/uL (ref 200–950)
NEUTROS PCT: 50 %
Neutro Abs: 2200 cells/uL (ref 1500–7800)
PLATELETS: 410 10*3/uL — AB (ref 140–400)
RBC: 4.33 MIL/uL (ref 3.80–5.10)
RDW: 14.1 % (ref 11.0–15.0)
WBC: 4.4 10*3/uL (ref 3.8–10.8)

## 2016-07-01 MED ORDER — CALCIUM 600-200 MG-UNIT PO TABS: 1.0000 | ORAL_TABLET | Freq: Two times a day (BID) | ORAL | 0 refills | Status: AC

## 2016-07-01 NOTE — Progress Notes (Signed)
Agree, start medication.  F/U in 2 weeks for BP check.

## 2016-07-01 NOTE — Progress Notes (Signed)
Patient is here for 2 week blood pressure check. Denies any headaches, blurred vision, palpitations, or any other medication problems. Patient misunderstood directions at last OV with PCP, she thought she was supposed to come back in 2 weeks and if BP was still high then to begin new BP Rx. Pt has not been on new BP Rx. Spoke with PCP regarding readings in office today and advised Pt to begin new BP Rx and come back for 2 week blood pressure check. Pt able to give correct read back of instruction. No further questions and follow up appt made prior to leaving clinic.

## 2016-07-02 LAB — LIPID PANEL
CHOL/HDL RATIO: 2.7 ratio (ref <=5.0)
Cholesterol: 254 mg/dL — ABNORMAL HIGH (ref 125–200)
HDL: 95 mg/dL (ref >=46)
LDL Cholesterol: 148 mg/dL — ABNORMAL HIGH (ref <130)
TRIGLYCERIDES: 53 mg/dL (ref <150)
VLDL: 11 mg/dL (ref <30)

## 2016-07-02 LAB — COMPLETE METABOLIC PANEL WITH GFR
ALBUMIN: 4.1 g/dL (ref 3.6–5.1)
ALK PHOS: 82 U/L (ref 33–130)
ALT: 12 U/L (ref 6–29)
AST: 21 U/L (ref 10–35)
BILIRUBIN TOTAL: 0.5 mg/dL (ref 0.2–1.2)
BUN: 12 mg/dL (ref 7–25)
CO2: 28 mmol/L (ref 20–31)
Calcium: 9.6 mg/dL (ref 8.6–10.4)
Chloride: 104 mmol/L (ref 98–110)
Creat: 0.86 mg/dL (ref 0.50–0.99)
GFR, EST AFRICAN AMERICAN: 80 mL/min (ref >=60)
GFR, Est Non African American: 70 mL/min (ref >=60)
Glucose, Bld: 94 mg/dL (ref 65–99)
Potassium: 4.1 mmol/L (ref 3.5–5.3)
SODIUM: 142 mmol/L (ref 135–146)
TOTAL PROTEIN: 7.3 g/dL (ref 6.1–8.1)

## 2016-07-02 LAB — TSH: TSH: 0.82 mIU/L

## 2016-07-06 ENCOUNTER — Other Ambulatory Visit: Payer: Self-pay

## 2016-07-06 DIAGNOSIS — R7989 Other specified abnormal findings of blood chemistry: Secondary | ICD-10-CM

## 2016-07-15 ENCOUNTER — Ambulatory Visit: Payer: BC Managed Care – PPO

## 2016-07-19 ENCOUNTER — Ambulatory Visit (INDEPENDENT_AMBULATORY_CARE_PROVIDER_SITE_OTHER): Payer: BC Managed Care – PPO | Admitting: Family Medicine

## 2016-07-19 VITALS — BP 136/67 | HR 63

## 2016-07-19 DIAGNOSIS — I10 Essential (primary) hypertension: Secondary | ICD-10-CM

## 2016-07-19 NOTE — Progress Notes (Signed)
Pt is here for a BP check. Denies headache, dizziness, and blurred vision. Will route to PCP for further review.

## 2016-07-19 NOTE — Progress Notes (Signed)
   Subjective:    Patient ID: Claudia HarmanRuth [name], female    DOB: 01-Mar-1946, 70 y.o.   MRN: [id number]  HPI    Review of Systems     Objective:   Physical Exam        Assessment & Plan:  Agree with below.  Nani Gasseratherine Metheney, MD

## 2016-08-30 ENCOUNTER — Other Ambulatory Visit: Payer: Self-pay | Admitting: *Deleted

## 2016-08-30 MED ORDER — LISINOPRIL-HYDROCHLOROTHIAZIDE 10-12.5 MG PO TABS: 1.0000 | ORAL_TABLET | Freq: Every day | ORAL | 1 refills | Status: DC

## 2016-11-22 ENCOUNTER — Ambulatory Visit (INDEPENDENT_AMBULATORY_CARE_PROVIDER_SITE_OTHER): Payer: BC Managed Care – PPO | Admitting: Family Medicine

## 2016-11-22 ENCOUNTER — Ambulatory Visit (INDEPENDENT_AMBULATORY_CARE_PROVIDER_SITE_OTHER): Payer: BC Managed Care – PPO | Admitting: Physical Therapy

## 2016-11-22 ENCOUNTER — Ambulatory Visit (INDEPENDENT_AMBULATORY_CARE_PROVIDER_SITE_OTHER): Payer: BC Managed Care – PPO

## 2016-11-22 ENCOUNTER — Encounter: Payer: Self-pay | Admitting: Physical Therapy

## 2016-11-22 VITALS — BP 151/71 | HR 66 | Wt 181.0 lb

## 2016-11-22 DIAGNOSIS — M545 Low back pain, unspecified: Secondary | ICD-10-CM

## 2016-11-22 DIAGNOSIS — R29810 Facial weakness: Secondary | ICD-10-CM | POA: Diagnosis not present

## 2016-11-22 DIAGNOSIS — M5136 Other intervertebral disc degeneration, lumbar region: Secondary | ICD-10-CM | POA: Diagnosis not present

## 2016-11-22 DIAGNOSIS — S39012A Strain of muscle, fascia and tendon of lower back, initial encounter: Secondary | ICD-10-CM

## 2016-11-22 DIAGNOSIS — M62830 Muscle spasm of back: Secondary | ICD-10-CM | POA: Diagnosis not present

## 2016-11-22 NOTE — Therapy (Signed)
[facility] [place] [address] [place], [place], [address] Phone: [phone]   Fax:  [phone]  Physical Therapy Evaluation  Patient Details  Name: Claudia [name] MRN: [id number] Date of Birth: [birth year] Referring Provider: Dr Teressa Lower   Encounter Date: 11/22/2016      PT End of Session - 11/22/16 1225    Visit Number 1   Number of Visits 12   Date for PT Re-Evaluation 01/03/17   PT Start Time 1142   PT Stop Time 1244   PT Time Calculation (min) 62 min   Activity Tolerance Patient limited by pain      Past Medical History:  Diagnosis Date  . Other and unspecified hyperlipidemia 11/08/2013    Past Surgical History:  Procedure Laterality Date  . CATARACT EXTRACTION    . ROTATOR CUFF REPAIR     right    There were no vitals filed for this visit.       Subjective Assessment - 11/22/16 1145    Subjective Pt reports waking up with low back pain about 3 days ago and it has gotten progressively worse.  Has tried muscle relaxer without relief.    Pertinent History borderline osteopenics   How long can you sit comfortably? immediate pain with sitting   Diagnostic tests x-rays   Patient Stated Goals function and reduce her pain   Currently in Pain? Yes   Pain Score 10-Worst pain ever   Pain Location Back   Pain Orientation Right   Pain Descriptors / Indicators Stabbing;Nagging   Pain Type Acute pain   Pain Onset In the past 7 days   Pain Frequency Constant   Aggravating Factors  transitioning positions, using Rt UE to lift   Pain Relieving Factors lying on Lt side in certain position, heat in the shower            [facility] PT Assessment - 11/22/16 0001      Assessment   Medical Diagnosis Rt lumbosacral strain   Referring Provider Dr Teressa Lower    Onset Date/Surgical Date 11/18/16   Next MD Visit not scheduled   Prior Therapy none     Precautions   Precautions None     Balance Screen   Has the patient fallen  in the past 6 months Yes   How many times? 1  3 months ago with walking - sprained ankle   Has the patient had a decrease in activity level because of a fear of falling?  --  hasn't walked due to ankle pain     Home Environment   Living Environment Private residence   Living Arrangements Spouse/significant other     Prior Function   Level of Independence Independent   Vocation Full time employment   Buyer, retail adults at [employer]    Leisure walk     Observation/Other Assessments   Focus on Therapeutic Outcomes (FOTO)  90% limited     Posture/Postural Control   Posture/Postural Control Postural limitations   Postural Limitations Rounded Shoulders;Forward head;Increased lumbar lordosis     ROM / Strength   AROM / PROM / Strength AROM;Strength     AROM   AROM Assessment Site Lumbar   Lumbar Flexion lower shin with pain   Lumbar Extension -10 degrees from neutral with pain   Lumbar - Right Rotation unable    Lumbar - Left Rotation decreased 75% with Rt sided low back pain     Strength   Overall Strength  Comments NA due to significant pain with all movement     Palpation   Spinal mobility unable to assess due to significant guarding   Palpation comment very tender and tight in Rt gluts, QL and lumbar paraspinals.      Bed Mobility   Bed Mobility --  pain with all movements     Transfers   Transfers --  pain with stand to supine required UE assist for LE's                    [facility] - 11/22/16 0001      Exercises   Exercises Lumbar     Lumbar Exercises: Stretches   Single Knee to Chest Stretch 1 rep;20 seconds   Double Knee to Chest Stretch 1 rep;20 seconds   Lower Trunk Rotation 5 reps   Pelvic Tilt 5 reps     Modalities   Modalities Electrical Stimulation;Moist Heat     Moist Heat Therapy   Number Minutes Moist Heat 20 Minutes   Moist Heat Location --  Rt low back and buttocks in sidelying      Electrical Stimulation   Electrical Stimulation Location Rt low back and buttocks in s/l   Electrical Stimulation Action IFC   Electrical Stimulation Parameters to tolerance   Electrical Stimulation Goals Pain;Tone     Manual Therapy   Manual Therapy Soft tissue mobilization   Soft tissue mobilization Rt gluts and low back STW          Trigger Point Dry Needling - 11/22/16 1236    Consent Given? Yes   Education Handout Provided Yes   Muscles Treated Upper Body Quadratus Lumborum;Longissimus   Muscles Treated Lower Body Gluteus maximus;Gluteus minimus   Longissimus Response Palpable increased muscle length;Twitch response elicited  Rt L2-4   Gluteus Maximus Response Palpable increased muscle length;Twitch response elicited  Rt   Gluteus Minimus Response Palpable increased muscle length;Twitch response elicited  Rt              PT Education - 11/22/16 1236    Education provided Yes   Education Details DN and HEP    Person(s) Educated Patient   Methods Demonstration;Handout   Comprehension Verbalized understanding             PT Long Term Goals - 11/22/16 1241      PT LONG TERM GOAL #1   Title I with advanced HEP ( 01/03/17)    Time 6   Period Weeks   Status New     PT LONG TERM GOAL #2   Title report back pain decrease =/< 2/10 to allow her to return to her walking program ( 01/03/17)    Time 6   Period Weeks   Status New     PT LONG TERM GOAL #3   Title demo =/> 5-/5 hip strength without pain ( 01/03/17)    Time 6   Period Weeks   Status New     PT LONG TERM GOAL #4   Title improve FOTO =/< 64% limited ( 01/03/17)    Time 6   Period Weeks   Status New               Plan - 11/22/16 1238    Clinical Impression Statement 71 yo female presents for moderate complexity PT eval for acute onset of Rt sided low back pain.  She is very limited in all mobility due to pain and tightness in  the Rt low back.  Unable to assess strength due to pain, she has  difficulty with bed mobility, ambulation and unable to stand still.    Rehab Potential Good   PT Frequency 2x / week   PT Duration 6 weeks   PT Treatment/Interventions Moist Heat;Traction;Ultrasound;Therapeutic exercise;Dry needling;Taping;Manual techniques;Neuromuscular re-education;Cryotherapy;Electrical Stimulation;Iontophoresis 4mg /ml Dexamethasone;Passive range of motion;Patient/family education   PT Next Visit Plan assess response to DN, continue manual work PRN and add in core stability ex.    Consulted and Agree with Plan of Care Patient      Patient will benefit from skilled therapeutic intervention in order to improve the following deficits and impairments:  Postural dysfunction, Pain, Increased muscle spasms, Decreased range of motion, Decreased activity tolerance  Visit Diagnosis: Acute right-sided low back pain without sciatica - Plan: PT plan of care cert/re-cert  Muscle spasm of back - Plan: PT plan of care cert/re-cert  Facial weakness - Plan: PT plan of care cert/re-cert     Problem List Patient Active Problem List   Diagnosis Date Noted  . Osteopenia of multiple sites 07/01/2016  . Other and unspecified hyperlipidemia 11/08/2013  . Essential hypertension 05/15/2012    Roderic Scarce PT  11/22/2016, 12:56 PM  [facility] [address] [place], [place], [address] Phone: [phone]   Fax:  [phone]  Name: Joeann Steppe MRN: [id number] Date of Birth: 1946/03/18

## 2016-11-22 NOTE — Patient Instructions (Signed)
Thank you for coming in today. Attend PT.  Recheck in 4 week or sooner if not better or if worsening.  Use a heating pad and over the counter tylenol or ibuprofen or use your old baclofen as needed.  Come back or go to the emergency room if you notice new weakness new numbness problems walking or bowel or bladder problems.  TENS UNIT: This is helpful for muscle pain and spasm.   Search and Purchase a TENS 7000 2nd edition at  www.tenspros.com or www.Amazon.com It should be less than $30.     TENS unit instructions: Do not shower or bathe with the unit on Turn the unit off before removing electrodes or batteries If the electrodes lose stickiness add a drop of water to the electrodes after they are disconnected from the unit and place on plastic sheet. If you continued to have difficulty, call the TENS unit company to purchase more electrodes. Do not apply lotion on the skin area prior to use. Make sure the skin is clean and dry as this will help prolong the life of the electrodes. After use, always check skin for unusual red areas, rash or other skin difficulties. If there are any skin problems, does not apply electrodes to the same area. Never remove the electrodes from the unit by pulling the wires. Do not use the TENS unit or electrodes other than as directed. Do not change electrode placement without consultating your therapist or physician. Keep 2 fingers with between each electrode. Wear time ratio is 2:1, on to off times.    For example on for 30 minutes off for 15 minutes and then on for 30 minutes off for 15 minutes     Lumbosacral Strain Lumbosacral strain is an injury that causes pain in the lower back (lumbosacral spine). This injury usually occurs from overstretching the muscles or ligaments along your spine. A strain can affect one or more muscles or cord-like tissues that connect bones to other bones (ligaments). What are the causes? This condition may be caused  by:  A hard, direct hit (blow) to the back.  Excessive stretching of the lower back muscles. This may result from:  A fall.  Lifting something heavy.  Repetitive movements such as bending or crouching. What increases the risk? The following factors may increase your risk of getting this condition:  Participating in sports or activities that involve:  A sudden twist of the back.  Pushing or pulling motions.  Being overweight or obese.  Having poor strength and flexibility, especially tight hamstrings or weak muscles in the back or abdomen.  Having too much of a curve in the lower back.  Having a pelvis that is tilted forward. What are the signs or symptoms? The main symptom of this condition is pain in the lower back, at the site of the strain. Pain may extend (radiate) down one or both legs. How is this diagnosed? This condition is diagnosed based on:  Your symptoms.  Your medical history.  A physical exam.  Your health care provider may push on certain areas of your back to determine the source of your pain.  You may be asked to bend forward, backward, and side to side to assess the severity of your pain and your range of motion.  Imaging tests, such as:  X-rays.  MRI. How is this treated? Treatment for this condition may include:  Putting heat and cold on the affected area.  Medicines to help relieve pain and relax your  muscles (muscle relaxants).  NSAIDs to help reduce swelling and discomfort. When your symptoms improve, it is important to gradually return to your normal routine as soon as possible to reduce pain, avoid stiffness, and avoid loss of muscle strength. Generally, symptoms should improve within 6 weeks of treatment. However, recovery time varies. Follow these instructions at home: Managing pain, stiffness, and swelling    If directed, put ice on the injured area during the first 24 hours after your strain.  Put ice in a plastic  bag.  Place a towel between your skin and the bag.  Leave the ice on for 20 minutes, 2-3 times a day.  If directed, put heat on the affected area as often as told by your health care provider. Use the heat source that your health care provider recommends, such as a moist heat pack or a heating pad.  Place a towel between your skin and the heat source.  Leave the heat on for 20-30 minutes.  Remove the heat if your skin turns bright red. This is especially important if you are unable to feel pain, heat, or cold. You may have a greater risk of getting burned. Activity   Rest and return to your normal activities as told by your health care provider. Ask your health care provider what activities are safe for you.  Avoid activities that take a lot of energy for as long as told by your health care provider. General instructions   Take over-the-counter and prescription medicines only as told by your health care provider.  Donot drive or use heavy machinery while taking prescription pain medicine.  Do not use any products that contain nicotine or tobacco, such as cigarettes and e-cigarettes. If you need help quitting, ask your health care provider.  Keep all follow-up visits as told by your health care provider. This is important. How is this prevented?  Use correct form when playing sports and lifting heavy objects.  Use good posture when sitting and standing.  Maintain a healthy weight.  Sleep on a mattress with medium firmness to support your back.  Be safe and responsible while being active to avoid falls.  Do at least 150 minutes of moderate-intensity exercise each week, such as brisk walking or water aerobics. Try a form of exercise that takes stress off your back, such as swimming or stationary cycling.  Maintain physical fitness, including:  Strength.  Flexibility.  Cardiovascular fitness.  Endurance. Contact a health care provider if:  Your back pain does not  improve after 6 weeks of treatment.  Your symptoms get worse. Get help right away if:  Your back pain is severe.  You cannot stand or walk.  You have difficulty controlling when you urinate or when you have a bowel movement.  You feel nauseous or you vomit.  Your feet get very cold.  You have numbness, tingling, weakness, or problems using your arms or legs.  You develop any of the following:  Shortness of breath.  Dizziness.  Pain in your legs.  Weakness in your buttocks or legs.  Discoloration of the skin on your toes or legs. This information is not intended to replace advice given to you by your health care provider. Make sure you discuss any questions you have with your health care provider. Document Released: 05/25/2005 Document Revised: 03/04/2016 Document Reviewed: 01/17/2016 Elsevier Interactive Patient Education  2017 [employer]

## 2016-11-22 NOTE — Progress Notes (Signed)
   Claudia [name] is a 71 y.o. female who [facility] today for right-sided low back pain. Patient notes acute onset of right low back pain occurring about 3 days ago. She denies any radiating pain weakness or numbness bowel bladder dysfunction. She notes pain occurs when she stands from a seated position. She's tried some leftover baclofen which helped a little bit made her feel tired. She denies any injury. She notes she has a history of osteopenia.   Past Medical History:  Diagnosis Date  . Other and unspecified hyperlipidemia 11/08/2013   Past Surgical History:  Procedure Laterality Date  . CATARACT EXTRACTION    . ROTATOR CUFF REPAIR     right   Social History  Substance Use Topics  . Smoking status: Never Smoker  . Smokeless tobacco: Never Used  . Alcohol use No     ROS:  As above   Medications: Current Outpatient Prescriptions  Medication Sig Dispense Refill  . Calcium 600-200 MG-UNIT tablet Take 1 tablet by mouth 2 (two) times daily. 1 tablet 0  . lisinopril-hydrochlorothiazide (PRINZIDE,ZESTORETIC) 10-12.5 MG tablet Take 1 tablet by mouth daily. 90 tablet 1  . AMBULATORY NON FORMULARY MEDICATION Take 2 capsules by mouth daily. Medication Name: Omax 3     No current facility-administered medications for this visit.    No Known Allergies   Exam:  BP (!) 151/71   Pulse 66   Wt 181 lb (82.1 kg)   BMI 32.06 kg/m  General: Well Developed, well nourished, and in no acute distress.  Neuro/Psych: Alert and oriented x3, extra-ocular muscles intact, able to move all 4 extremities, sensation grossly intact. Skin: Warm and dry, no rashes noted.  Respiratory: Not using accessory muscles, speaking in full sentences, trachea midline.  Cardiovascular: Pulses palpable, no extremity edema. Abdomen: Does not appear distended. MSK: L spine: Nontender to midline. Tender palpation right SI joint. Decreased lumbar motion and  extension rotation and lateral flexion. Lower extremity strength is equal and normal throughout. Sensation is intact throughout lower extremities. Antalgic gait.  X-ray L-spine pending    No results found for this or any previous visit (from the past 48 hour(s)). No results found.    Assessment and Plan: 71 y.o. female with lumbosacral strain due to myofascial disruption. Plan to treat with physical therapy heating pad TENS unit left over baclofen and NSAIDs as needed. X-ray pending. Recheck in 4 weeks if not better.    Orders Placed This Encounter  Procedures  . DG Lumbar Spine Complete    Standing Status:   Future    Number of Occurrences:   1    Standing Expiration Date:   01/22/2018    Order Specific Question:   Reason for Exam (SYMPTOM  OR DIAGNOSIS REQUIRED)    Answer:   eval lumbar pain    Order Specific Question:   Preferred imaging location?    Answer[facility]  . Ambulatory referral to Physical Therapy    Referral Priority:   Routine    Referral Type:   Physical Medicine    Referral Reason:   Specialty Services Required    Requested Specialty:   Physical Therapy    Number of Visits Requested:   1    Discussed warning signs or symptoms. Please see discharge instructions. Patient expresses understanding.

## 2016-11-22 NOTE — Patient Instructions (Addendum)
Trigger Point Dry Needling  . What is Trigger Point Dry Needling (DN)? o DN is a physical therapy technique used to treat muscle pain and dysfunction. Specifically, DN helps deactivate muscle trigger points (muscle knots).  o A thin filiform needle is used to penetrate the skin and stimulate the underlying trigger point. The goal is for a local twitch response (LTR) to occur and for the trigger point to relax. No medication of any kind is injected during the procedure.   . What Does Trigger Point Dry Needling Feel Like?  o The procedure feels different for each individual patient. Some patients report that they do not actually feel the needle enter the skin and overall the process is not painful. Very mild bleeding may occur. However, many patients feel a deep cramping in the muscle in which the needle was inserted. This is the local twitch response.   Marland Kitchen. How Will I feel after the treatment? o Soreness is normal, and the onset of soreness may not occur for a few hours. Typically this soreness does not last longer than two days.  o Bruising is uncommon, however; ice can be used to decrease any possible bruising.  o In rare cases feeling tired or nauseous after the treatment is normal. In addition, your symptoms may get worse before they get better, this period will typically not last longer than 24 hours.   . What Can I do After My Treatment? o Increase your hydration by drinking more water for the next 24 hours. o You may place ice or heat on the areas treated that have become sore, however, do not use heat on inflamed or bruised areas. Heat often brings more relief post needling. o You can continue your regular activities, but vigorous activity is not recommended initially after the treatment for 24 hours. o DN is best combined with other physical therapy such as strengthening, stretching, and other therapies.    Return to walking program as soon as able, Keeping lower abdominals engaged.     Lower Trunk Rotation Stretch    Keeping back flat and feet together, rotate knees to left side. Hold 1-2____ seconds. Repeat __10__ times per set. Do _1___ sets per session. Do ___2_ sessions per day.  Knee-to-Chest Stretch: Unilateral    With hand behind right knee, pull knee in to chest until a comfortable stretch is felt in lower back and buttocks. Keep back relaxed. Hold __20-30__ seconds. Repeat __2 times per set. Do __1__ sets per session. Do __2_ sessions per day. Repeat on the other leg.   Knee-to-Chest Stretch: Bilateral    With hands behind knees, pull both knees in to chest until a comfortable stretch is felt in lower back and buttocks. Keep back relaxed. Hold __20-30__ seconds. Repeat __2__ times per set. Do __1__ sets per session. Do __2__ sessions per day.  Pelvic Tilt: Posterior - Legs Bent (Supine)    Tighten stomach and flatten back by rolling pelvis down. Hold _2-3___ seconds. Relax. Repeat __10-20_ times per set. Do ___1_ sets per session. Do __2__ sessions per day.  Copyright  VHI. All rights reserved.

## 2016-11-28 ENCOUNTER — Encounter: Payer: BC Managed Care – PPO | Admitting: Rehabilitative and Restorative Service Providers"

## 2016-11-28 ENCOUNTER — Telehealth: Payer: Self-pay | Admitting: Rehabilitative and Restorative Service Providers"

## 2016-11-28 NOTE — Telephone Encounter (Signed)
TC to patient  - let message with request to call to schedule f/u visit.  Eben Choinski P. Leonor Liv PT, MPH 11/28/16 3:59 PM

## 2016-12-01 ENCOUNTER — Encounter: Payer: BC Managed Care – PPO | Admitting: Physical Therapy

## 2016-12-01 ENCOUNTER — Ambulatory Visit (INDEPENDENT_AMBULATORY_CARE_PROVIDER_SITE_OTHER): Payer: BC Managed Care – PPO | Admitting: Physical Therapy

## 2016-12-01 DIAGNOSIS — M62830 Muscle spasm of back: Secondary | ICD-10-CM

## 2016-12-01 DIAGNOSIS — M545 Low back pain, unspecified: Secondary | ICD-10-CM

## 2016-12-01 NOTE — Patient Instructions (Addendum)
  Knee Fold   Lie on back, legs bent, arms by sides. Exhale, lifting knee to chest. Inhale, returning. Keep abdominals flat, navel to spine. Repeat __10__ times, alternating legs. Do __2__ sessions per day.  Copyright  VHI. All rights reserved.   Knee Drop   Keep pelvis stable. Without rotating hips, slowly drop knee to side, pause, return to center, bring knee across midline toward opposite hip. Feel obliques engaging. Repeat for ___10_ times each leg.   Heel Slide to Straight   Slide one leg down to straight. Return. Be sure pelvis does not rock forward, tilt, rotate, or tip to side. Do _10__ times. Restabilize pelvis. Repeat with other leg. Do __1-2_ sets, __2_ times per day.  http://ss.exer.us/16   Copyright  VHI. All rights reserved.   Bridging    Slowly raise buttocks from floor, keeping stomach tight.  Hold for 5 seconds. Repeat __10__ times per set. Do __1__ sets per session. Do __1__ sessions per day.  http://orth.exer.us/1097   Copyright  VHI. All rights reserved.   Hip Abduction: Side-Lying (Single Leg)    Lie on side with knees bent, tubing around thighs just above knees. Raise top leg, keeping knee bent. Repeat _10_ times per set. Repeat on other side. Do _1_ sets per session. Do _3-5_ sessions per week.  http://tub.exer.us/44   Copyright  VHI. All rights reserved.   Opposite Arm / Leg Lift (Prone)    Abdomen and head supported, left knee locked, raise leg and opposite arm __4-6__ inches from floor. Repeat _10___ times per set. Do __1__ sets per session. Do __1__ sessions per day.  http://orth.exer.us/1115   Copyright  VHI. All rights reserved.      TENS UNIT  This is helpful for muscle pain and spasm.   Search and Purchase a TENS 7000 2nd edition at www.tenspros.com or www.amazon.com  (It should be less than $30)     TENS unit instructions:   Do not shower or bathe with the unit on  Turn the unit off before removing  electrodes or batteries  If the electrodes lose stickiness add a drop of water to the electrodes after they are disconnected from the unit and place on plastic sheet. If you continued to have difficulty, call the TENS unit company to purchase more electrodes.  Do not apply lotion on the skin area prior to use. Make sure the skin is clean and dry as this will help prolong the life of the electrodes.  After use, always check skin for unusual red areas, rash or other skin difficulties. If there are any skin problems, does not apply electrodes to the same area.  Never remove the electrodes from the unit by pulling the wires.  Do not use the TENS unit or electrodes other than as directed.  Do not change electrode placement without consulting your therapist or physician.  Keep 2 fingers with between each electrode.

## 2016-12-01 NOTE — Therapy (Addendum)
[place] [address] [place], [place], [address] Phone: [phone]   Fax:  [phone]  Physical Therapy Treatment  Patient Details  Name: Claudia [name] MRN: [id number] Date of Birth: 02-03-1946 Referring Provider: Dr Steva Colder   Encounter Date: 12/01/2016      PT End of Session - 12/01/16 1440    Visit Number 2   Number of Visits 12   Date for PT Re-Evaluation 01/03/17   PT Start Time 1400   PT Stop Time 1455   PT Time Calculation (min) 55 min   Activity Tolerance Patient tolerated treatment well   Behavior During Therapy [facility] for tasks assessed/performed      Past Medical History:  Diagnosis Date  . Other and unspecified hyperlipidemia 11/08/2013    Past Surgical History:  Procedure Laterality Date  . CATARACT EXTRACTION    . ROTATOR CUFF REPAIR     right    There were no vitals filed for this visit.      Subjective Assessment - 12/01/16 1359    Subjective feels she is "90%" better.  still having some soreness and tenderness   Pertinent History borderline osteopenics   How long can you sit comfortably? immediate pain with sitting   Patient Stated Goals function and reduce her pain   Currently in Pain? Yes   Pain Score 4    Pain Location Back   Pain Orientation Right   Pain Descriptors / Indicators Tender;Nagging   Pain Type Acute pain   Pain Onset 1 to 4 weeks ago   Pain Frequency Constant   Aggravating Factors  transitioning positions, using Rt UE to lift   Pain Relieving Factors lying on Lt side in certain positions, heat in the shower                         [facility] Adult PT Treatment/Exercise - 12/01/16 1403      Lumbar Exercises: Stretches   Single Knee to Chest Stretch 2 reps;30 seconds   Single Knee to Chest Stretch Limitations bil   Double Knee to Chest Stretch 2 reps;30 seconds   Lower Trunk Rotation --  10 reps; 2 sec hold     Lumbar Exercises: Aerobic   Stationary  Bike NuStep L4 x 5 min     Lumbar Exercises: Supine   Ab Set 10 reps;5 seconds   Clam 10 reps   Clam Limitations alt; bil   Heel Slides 10 reps   Heel Slides Limitations alt; bil   Bent Knee Raise 10 reps   Bent Knee Raise Limitations alt; bil   Bridge 10 reps;5 seconds     Lumbar Exercises: Sidelying   Clam 10 reps   Clam Limitations green theraband; bil     Lumbar Exercises: Prone   Opposite Arm/Leg Raise 5 reps;Left arm/Right leg;Right arm/Left leg     Moist Heat Therapy   Number Minutes Moist Heat 15 Minutes   Moist Heat Location --  Rt low back and buttocks in sidelying     Electrical Stimulation   Electrical Stimulation Location Rt low back and buttocks in s/l   Electrical Stimulation Action IFC   Electrical Stimulation Parameters to tolerance   Electrical Stimulation Goals Pain;Tone                PT Education - 12/01/16 1439    Education provided Yes   Education Details TENS and HEP   Person(s) Educated Patient  Methods Explanation;Demonstration;Handout   Comprehension Verbalized understanding             PT Long Term Goals - 11/22/16 1241      PT LONG TERM GOAL #1   Title I with advanced HEP ( 01/03/17)    Time 6   Period Weeks   Status New     PT LONG TERM GOAL #2   Title report back pain decrease =/< 2/10 to allow her to return to her walking program ( 01/03/17)    Time 6   Period Weeks   Status New     PT LONG TERM GOAL #3   Title demo =/> 5-/5 hip strength without pain ( 01/03/17)    Time 6   Period Weeks   Status New     PT LONG TERM GOAL #4   Title improve FOTO =/< 64% limited ( 01/03/17)    Time 6   Period Weeks   Status New               Plan - 12/01/16 1441    Clinical Impression Statement Session focued on HEP progression as pt requesting transition to home program as she is self reported 90% better.  Plan to follow up in 2 weeks to see how pt is doing.  All exercises tolerated today without increase in pain.   PT  Treatment/Interventions Moist Heat;Traction;Ultrasound;Therapeutic exercise;Dry needling;Taping;Manual techniques;Neuromuscular re-education;Cryotherapy;Electrical Stimulation;Iontophoresis 34m/ml Dexamethasone;Passive range of motion;Patient/family education   PT Next Visit Plan continue manual work PRN and add in core stability ex., see how pt is doing with home program and tx as indicated   Consulted and Agree with Plan of Care Patient      Patient will benefit from skilled therapeutic intervention in order to improve the following deficits and impairments:  Postural dysfunction, Pain, Increased muscle spasms, Decreased range of motion, Decreased activity tolerance  Visit Diagnosis: Acute right-sided low back pain without sciatica  Muscle spasm of back     Problem List Patient Active Problem List   Diagnosis Date Noted  . Osteopenia of multiple sites 07/01/2016  . Other and unspecified hyperlipidemia 11/08/2013  . Essential hypertension 05/15/2012      SLaureen Abrahams PT, DPT 12/01/16 2:43 PM    [facility] [place] [address]Phone: [phone]  Fax:  [phone] Name: Claudia WhetselMRN: [id number] of Birth: [birth year]  PHYSICAL THERAPY DISCHARGE SUMMARY  Visits from Start of Care: 3 Current functional level related to goals / functional outcomes: Patient reports she was 90% improved at her last PT visit   Remaining deficits: unknown   Education / Equipment: HEP Plan:                                                    Patient goals were not met.They were not reassessed. She reported she was 90% imporved.  Patient is being discharged due to not returning since the last visit.  ?????    SJeral Pinch PT 02/01/17 3:32 PM

## 2016-12-19 ENCOUNTER — Ambulatory Visit: Payer: BC Managed Care – PPO | Admitting: Family Medicine

## 2017-03-23 ENCOUNTER — Other Ambulatory Visit: Payer: Self-pay | Admitting: Family Medicine

## 2017-04-21 ENCOUNTER — Other Ambulatory Visit: Payer: Self-pay | Admitting: Family Medicine

## 2017-04-24 ENCOUNTER — Telehealth: Payer: Self-pay | Admitting: Family Medicine

## 2017-04-24 NOTE — Telephone Encounter (Signed)
Pt called. She is requesting refill on Lisinopril.  She has an appt with PCP on 9/4.  Thank you,

## 2017-04-25 NOTE — Telephone Encounter (Signed)
Short term supply sent in. Left VM advising Pt.

## 2017-05-02 ENCOUNTER — Ambulatory Visit: Payer: BC Managed Care – PPO | Admitting: Family Medicine

## 2017-05-02 DIAGNOSIS — Z0189 Encounter for other specified special examinations: Secondary | ICD-10-CM

## 2017-05-02 NOTE — Progress Notes (Deleted)
Subjective:    CC: HTN  HPI:  Hypertension- Pt denies chest pain, SOB, dizziness, or heart palpitations.  Taking meds as directed w/o problems.  Denies medication side effects.    She was also noted to have an elevated platelet count last October. She was encouraged to return in one month to have that rechecked. She did not.  Past medical history, Surgical history, Family history not pertinant except as noted below, Social history, Allergies, and medications have been entered into the medical record, reviewed, and corrections made.   Review of Systems: No fevers, chills, night sweats, weight loss, chest pain, or shortness of breath.   Objective:    General: Well Developed, well nourished, and in no acute distress.  Neuro: Alert and oriented x3, extra-ocular muscles intact, sensation grossly intact.  HEENT: Normocephalic, atraumatic  Skin: Warm and dry, no rashes. Cardiac: Regular rate and rhythm, no murmurs rubs or gallops, no lower extremity edema.  Respiratory: Clear to auscultation bilaterally. Not using accessory muscles, speaking in full sentences.   Impression and Recommendations:    HTN - due for BMP.  Elevated platelet count-recheck CBC.

## 2017-05-08 ENCOUNTER — Ambulatory Visit (INDEPENDENT_AMBULATORY_CARE_PROVIDER_SITE_OTHER): Payer: BC Managed Care – PPO | Admitting: Family Medicine

## 2017-05-08 ENCOUNTER — Encounter: Payer: Self-pay | Admitting: Family Medicine

## 2017-05-08 VITALS — BP 140/82 | HR 56 | Wt 176.0 lb

## 2017-05-08 DIAGNOSIS — R05 Cough: Secondary | ICD-10-CM | POA: Diagnosis not present

## 2017-05-08 DIAGNOSIS — T887XXA Unspecified adverse effect of drug or medicament, initial encounter: Secondary | ICD-10-CM

## 2017-05-08 DIAGNOSIS — I10 Essential (primary) hypertension: Secondary | ICD-10-CM | POA: Diagnosis not present

## 2017-05-08 DIAGNOSIS — Z23 Encounter for immunization: Secondary | ICD-10-CM | POA: Diagnosis not present

## 2017-05-08 DIAGNOSIS — T464X5A Adverse effect of angiotensin-converting-enzyme inhibitors, initial encounter: Secondary | ICD-10-CM

## 2017-05-08 MED ORDER — LOSARTAN POTASSIUM-HCTZ 50-12.5 MG PO TABS: 1.0000 | ORAL_TABLET | Freq: Every day | ORAL | 1 refills | Status: DC

## 2017-05-08 NOTE — Progress Notes (Signed)
   Subjective:    Patient ID: Claudia HarmanRuth [name], female    DOB: August 21, 1946, 71 y.o.   MRN: [id number]  HPI Hypertension- Pt denies chest pain, SOB, dizziness, or heart palpitations.  Taking meds as directed w/o problems.  Has had a dry intermittant cough. Not too bothersome but wonders if could be the medication. Doesn't think reflux or post nasal drip.      Review of Systems     Objective:   Physical Exam  Constitutional: She is oriented to person, place, and time. She appears well-developed and well-nourished.  HENT:  Head: Normocephalic and atraumatic.  Cardiovascular: Normal rate, regular rhythm and normal heart sounds.   Pulmonary/Chest: Effort normal and breath sounds normal.  Neurological: She is alert and oriented to person, place, and time.  Skin: Skin is warm and dry.  Psychiatric: She has a normal mood and affect. Her behavior is normal.        Assessment & Plan:  HTN - BP at goal. F/U in 6 months.   Medication S.E/Cough on ACE - change to ARB. Cal if cough not resolving.   Flu vaccine given today.   Reminded due for colonoscopy later this year.

## 2017-05-09 LAB — COMPLETE METABOLIC PANEL WITH GFR
AG Ratio: 1.5 (calc) (ref 1.0–2.5)
ALBUMIN MSPROF: 4.2 g/dL (ref 3.6–5.1)
ALT: 10 U/L (ref 6–29)
AST: 19 U/L (ref 10–35)
Alkaline phosphatase (APISO): 72 U/L (ref 33–130)
BUN: 10 mg/dL (ref 7–25)
CALCIUM: 9.7 mg/dL (ref 8.6–10.4)
CHLORIDE: 101 mmol/L (ref 98–110)
CO2: 29 mmol/L (ref 20–32)
Creat: 0.68 mg/dL (ref 0.50–0.99)
GFR, EST AFRICAN AMERICAN: 103 mL/min/{1.73_m2} (ref >=60)
GFR, Est Non African American: 89 mL/min/{1.73_m2} (ref >=60)
GLOBULIN: 2.8 g/dL (ref 1.9–3.7)
Glucose, Bld: 83 mg/dL (ref 65–99)
POTASSIUM: 3.9 mmol/L (ref 3.5–5.3)
Sodium: 140 mmol/L (ref 135–146)
Total Bilirubin: 0.6 mg/dL (ref 0.2–1.2)
Total Protein: 7 g/dL (ref 6.1–8.1)

## 2017-05-09 NOTE — Progress Notes (Signed)
All labs are normal. 

## 2017-05-11 ENCOUNTER — Ambulatory Visit: Payer: BC Managed Care – PPO | Admitting: Family Medicine

## 2017-05-24 ENCOUNTER — Ambulatory Visit (INDEPENDENT_AMBULATORY_CARE_PROVIDER_SITE_OTHER): Payer: BC Managed Care – PPO | Admitting: Physician Assistant

## 2017-05-24 VITALS — BP 136/71 | HR 70

## 2017-05-24 DIAGNOSIS — I10 Essential (primary) hypertension: Secondary | ICD-10-CM

## 2017-05-24 NOTE — Progress Notes (Signed)
Pt advised,verbalized understanding. 

## 2017-05-24 NOTE — Progress Notes (Signed)
Pt came into clinic today for BP check. At last OV, Pt's Rx from lisinopril was changed to Hyzaar due to dry cough. Pt reports since changing Rx's she overall feels "better" but has described "flu like symptoms" in the morning when she wakes up. She denies any muscle pain, fatigue, dizziness, or chest pain but reports increased congestion with productive cough. This subsides once she is up and moving. Pt's BP was within range in clinic today. Will route to a Provider in office for review.   I would give it another 2-3 weeks since BP is great and overall you are feeling better. Since you are also having congestion and cough could be fighting off a virus as well. Follow up with pcp in 2 weeks if not improving. Tandy Gaw PA-C

## 2017-11-20 ENCOUNTER — Telehealth: Payer: Self-pay

## 2017-11-20 MED ORDER — LOSARTAN POTASSIUM-HCTZ 50-12.5 MG PO TABS: 1.0000 | ORAL_TABLET | Freq: Every day | ORAL | 0 refills | Status: DC

## 2017-11-20 NOTE — Telephone Encounter (Signed)
Pt called stating she has an appt on 11-23-17 but does not have enough of her Losartan to last her until then. Pt requesting a refill be sent to [facility] on [address]

## 2017-11-20 NOTE — Telephone Encounter (Signed)
30 day RX sent to pt's local pharmacy.

## 2017-11-23 ENCOUNTER — Encounter: Payer: Self-pay | Admitting: Family Medicine

## 2017-11-23 ENCOUNTER — Ambulatory Visit: Payer: BC Managed Care – PPO | Admitting: Family Medicine

## 2017-11-23 VITALS — BP 135/72 | HR 71 | Ht 63.0 in | Wt 181.0 lb

## 2017-11-23 DIAGNOSIS — Z1159 Encounter for screening for other viral diseases: Secondary | ICD-10-CM

## 2017-11-23 DIAGNOSIS — R7989 Other specified abnormal findings of blood chemistry: Secondary | ICD-10-CM | POA: Diagnosis not present

## 2017-11-23 DIAGNOSIS — I10 Essential (primary) hypertension: Secondary | ICD-10-CM

## 2017-11-23 MED ORDER — LOSARTAN POTASSIUM-HCTZ 50-12.5 MG PO TABS: 1.0000 | ORAL_TABLET | Freq: Every day | ORAL | 2 refills | Status: DC

## 2017-11-23 NOTE — Progress Notes (Signed)
Subjective:    CC: HTN  HPI:  Hypertension- Pt denies chest pain, SOB, dizziness, or heart palpitations.  Taking meds as directed w/o problems.  Denies medication side effects.    Follow-up hyperlipidemia-not currently on a statin.  Lab Results  Component Value Date   CHOL 254 (H) 07/01/2016   HDL 95 07/01/2016   LDLCALC 148 (H) 07/01/2016   TRIG 53 07/01/2016   CHOLHDL 2.7 07/01/2016    Elevated platelet levels back in November 2017. 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  Denies any chest pain, shortness of breath, cough, fever, or night sweats.  Past medical history, Surgical history, Family history not pertinant except as noted below, Social history, Allergies, and medications have been entered into the medical record, reviewed, and corrections made.   Review of Systems: No fevers, chills, night sweats, weight loss, chest pain, or shortness of breath.   Objective:    General: Well Developed, well nourished, and in no acute distress.  Neuro: Alert and oriented x3, extra-ocular muscles intact, sensation grossly intact.  HEENT: Normocephalic, atraumatic  Skin: Warm and dry, no rashes. Cardiac: Regular rate and rhythm, no murmurs rubs or gallops, no lower extremity edema.  Respiratory: Clear to auscultation bilaterally. Not using accessory muscles, speaking in full sentences.   Impression and  Recommendations:    HTN - Well controlled. Continue current regimen. Follow up in  6 months.  Due for CMP and lipid panel.  Hyperlipidemia-due to recheck lipids.   Elevated platelets-due to recheck levels.  Letter provided saying that she does not have any active symptoms of TB.  She is due for screening colonosocpy.    Encouraged her to schedule her mammogram.  Hep C screening added to labs.

## 2017-11-23 NOTE — Patient Instructions (Signed)
You are due for screening colonoscopy.  It has been 10 years since her last one.  We will be happy to refer you to GI for another scope if you are up to it.  If not we can provide you with stool cards. Please schedule your mammogram this spring

## 2017-12-21 LAB — CBC
HEMATOCRIT: 34.5 % — AB (ref 35.0–45.0)
HEMOGLOBIN: 11.3 g/dL — AB (ref 11.7–15.5)
MCH: 27.6 pg (ref 27.0–33.0)
MCHC: 32.8 g/dL (ref 32.0–36.0)
MCV: 84.1 fL (ref 80.0–100.0)
MPV: 9.7 fL (ref 7.5–12.5)
Platelets: 455 10*3/uL — ABNORMAL HIGH (ref 140–400)
RBC: 4.1 10*6/uL (ref 3.80–5.10)
RDW: 13.3 % (ref 11.0–15.0)
WBC: 4.5 10*3/uL (ref 3.8–10.8)

## 2017-12-22 ENCOUNTER — Other Ambulatory Visit: Payer: Self-pay | Admitting: *Deleted

## 2017-12-22 LAB — COMPLETE METABOLIC PANEL WITH GFR
AG RATIO: 1.6 (calc) (ref 1.0–2.5)
ALBUMIN MSPROF: 4.2 g/dL (ref 3.6–5.1)
ALKALINE PHOSPHATASE (APISO): 76 U/L (ref 33–130)
ALT: 11 U/L (ref 6–29)
AST: 18 U/L (ref 10–35)
BILIRUBIN TOTAL: 0.6 mg/dL (ref 0.2–1.2)
BUN: 9 mg/dL (ref 7–25)
CHLORIDE: 103 mmol/L (ref 98–110)
CO2: 29 mmol/L (ref 20–32)
Calcium: 9.9 mg/dL (ref 8.6–10.4)
Creat: 0.77 mg/dL (ref 0.60–0.93)
GFR, Est African American: 91 mL/min/{1.73_m2} (ref >=60)
GFR, Est Non African American: 78 mL/min/{1.73_m2} (ref >=60)
GLOBULIN: 2.7 g/dL (ref 1.9–3.7)
Glucose, Bld: 78 mg/dL (ref 65–99)
Potassium: 3.8 mmol/L (ref 3.5–5.3)
SODIUM: 139 mmol/L (ref 135–146)
Total Protein: 6.9 g/dL (ref 6.1–8.1)

## 2017-12-22 LAB — LIPID PANEL
CHOL/HDL RATIO: 3.1 (calc) (ref <5.0)
CHOLESTEROL: 261 mg/dL — AB (ref <200)
HDL: 84 mg/dL (ref >50)
LDL Cholesterol (Calc): 164 mg/dL (calc) — ABNORMAL HIGH
Non-HDL Cholesterol (Calc): 177 mg/dL (calc) — ABNORMAL HIGH (ref <130)
Triglycerides: 44 mg/dL (ref <150)

## 2017-12-22 LAB — FERRITIN: Ferritin: 46 ng/mL (ref 20–288)

## 2017-12-22 LAB — VITAMIN B12: Vitamin B-12: 477 pg/mL (ref 200–1100)

## 2017-12-22 LAB — HEPATITIS C ANTIBODY
Hepatitis C Ab: NONREACTIVE
SIGNAL TO CUT-OFF: 0.04 (ref <1.00)

## 2017-12-22 LAB — FOLATE: Folate: 20.9 ng/mL

## 2018-04-04 ENCOUNTER — Encounter: Payer: Self-pay | Admitting: Family Medicine

## 2018-04-05 ENCOUNTER — Other Ambulatory Visit: Payer: Self-pay | Admitting: Family Medicine

## 2018-04-05 MED ORDER — HYDROCHLOROTHIAZIDE 12.5 MG PO CAPS: 12.5000 mg | ORAL_CAPSULE | Freq: Every day | ORAL | 1 refills | Status: DC

## 2018-04-05 MED ORDER — LOSARTAN POTASSIUM 50 MG PO TABS: 50.0000 mg | ORAL_TABLET | Freq: Every day | ORAL | 1 refills | Status: DC

## 2018-04-05 NOTE — Progress Notes (Signed)
Pharmacy requested that we separate the losartan HCT product into individual prescriptions because of a national back order.  [facility]

## 2018-04-24 ENCOUNTER — Ambulatory Visit (INDEPENDENT_AMBULATORY_CARE_PROVIDER_SITE_OTHER): Payer: Medicare Other | Admitting: Family Medicine

## 2018-04-24 ENCOUNTER — Encounter: Payer: Self-pay | Admitting: Family Medicine

## 2018-04-24 VITALS — BP 138/73 | HR 66 | Ht 63.0 in | Wt 188.0 lb

## 2018-04-24 DIAGNOSIS — M25561 Pain in right knee: Secondary | ICD-10-CM | POA: Diagnosis not present

## 2018-04-24 DIAGNOSIS — M25562 Pain in left knee: Secondary | ICD-10-CM

## 2018-04-24 DIAGNOSIS — Z0000 Encounter for general adult medical examination without abnormal findings: Secondary | ICD-10-CM | POA: Diagnosis not present

## 2018-04-24 DIAGNOSIS — Z1211 Encounter for screening for malignant neoplasm of colon: Secondary | ICD-10-CM | POA: Diagnosis not present

## 2018-04-24 DIAGNOSIS — Z23 Encounter for immunization: Secondary | ICD-10-CM

## 2018-04-24 NOTE — Progress Notes (Signed)
Subjective:   Ricci Paff is a 72 y.o. female who presents for Medicare Annual (Subsequent) preventive examination. She retired a month ago.  She had been having some bilateral knee pain but worse on the left compared to the right.  Infection was made an appointment but backed off her exercise a little and has started again and is doing much better.  She never noticed it was worse when she been sitting for a while and then would try to get up.  She worked out today with no problems.  Denies any swelling of the joints.  Review of Systems:  Criss Alvine of review of systems is negative except for HPI.       Objective:     Vitals: BP 138/73   Pulse 66   Ht 5\' 3"  (1.6 m)   Wt 188 lb (85.3 kg)   SpO2 99%   BMI 33.30 kg/m   Body mass index is 33.3 kg/m.  Advanced Directives 04/24/2018 11/22/2016  Does Patient Have a Medical Advance Directive? No No  Would patient like information on creating a medical advance directive? No - Patient declined No - Patient declined    Tobacco Social History   Tobacco Use  Smoking Status Never Smoker  Smokeless Tobacco Never Used     Counseling given: Not Answered   Clinical Intake: Physical Exam  Constitutional: She is oriented to person, place, and time. She appears well-developed and well-nourished.  HENT:  Head: Normocephalic and atraumatic.  Right Ear: External ear normal.  Left Ear: External ear normal.  Nose: Nose normal.  Mouth/Throat: Oropharynx is clear and moist.  TMs and canals are clear.   Eyes: Pupils are equal, round, and reactive to light. Conjunctivae and EOM are normal.  Neck: Neck supple. No thyromegaly present.  Cardiovascular: Normal rate, regular rhythm and normal heart sounds.  Pulmonary/Chest: Effort normal and breath sounds normal. She has no wheezes.  Musculoskeletal:  Patellar reflexes 2+ bilaterally.  Lymphadenopathy:    She has no cervical adenopathy.  Neurological: She is alert and oriented to person, place, and  time.  Skin: Skin is warm and dry.  Psychiatric: She has a normal mood and affect.                         Past Medical History:  Diagnosis Date  . Essential hypertension   . Other and unspecified hyperlipidemia 11/08/2013   Past Surgical History:  Procedure Laterality Date  . CATARACT EXTRACTION    . ROTATOR CUFF REPAIR     right   Family History  Problem Relation Age of Onset  . Hypertension Mother   . Stroke Father    Social History   Socioeconomic History  . Marital status: Married    Spouse name: Not on file  . Number of children: Not on file  . Years of education: Not on file  . Highest education level: Not on file  Occupational History  . Not on file  Social Needs  . Financial resource strain: Not on file  . Food insecurity:    Worry: Not on file    Inability: Not on file  . Transportation needs:    Medical: Not on file    Non-medical: Not on file  Tobacco Use  . Smoking status: Never Smoker  . Smokeless tobacco: Never Used  Substance and Sexual Activity  . Alcohol use: No  . Drug use: No  . Sexual activity: Yes    Birth  control/protection: Post-menopausal  Lifestyle  . Physical activity:    Days per week: Not on file    Minutes per session: Not on file  . Stress: Not on file  Relationships  . Social connections:    Talks on phone: Not on file    Gets together: Not on file    Attends religious service: Not on file    Active member of club or organization: Not on file    Attends meetings of clubs or organizations: Not on file    Relationship status: Not on file  Other Topics Concern  . Not on file  Social History Narrative  . Not on file    Outpatient Encounter Medications as of 04/24/2018  Medication Sig  . AMBULATORY NON FORMULARY MEDICATION Take 2 capsules by mouth daily. Medication Name: Omax 3  . Calcium 600-200 MG-UNIT tablet Take 1 tablet by mouth 2 (two) times daily.  . hydrochlorothiazide (MICROZIDE) 12.5 MG capsule  Take 1 capsule (12.5 mg total) by mouth daily.  Marland Kitchen. losartan (COZAAR) 50 MG tablet Take 1 tablet (50 mg total) by mouth daily.   No facility-administered encounter medications on file as of 04/24/2018.     Activities of Daily Living In your present state of health, do you have any difficulty performing the following activities: 04/24/2018  Hearing? N  Vision? N  Difficulty concentrating or making decisions? N  Walking or climbing stairs? N  Dressing or bathing? N  Doing errands, shopping? N  Some recent data might be hidden    Patient Care Team: Agapito GamesMetheney, Catherine D, MD as PCP - General (Family Medicine)    Assessment:   This is a routine wellness examination for Windell MouldingRuth.  Exercise Activities and Dietary recommendations Current Exercise Habits: Structured exercise class, Frequency (Times/Week): 5, Exercise limited by: None identified  Goals   None     Fall Risk Fall Risk  11/23/2017 05/08/2017 11/08/2013  Falls in the past year? No No No   I Depression Screen PHQ 2/9 Scores 11/23/2017 05/08/2017 11/08/2013  PHQ - 2 Score 0 0 0     Cognitive Function     6CIT Screen 04/24/2018  What Year? 0 points  What month? 0 points  What time? 0 points  Count back from 20 0 points  Months in reverse 0 points  Repeat phrase 8 points  Total Score 8    Immunization History  Administered Date(s) Administered  . BCG 08/29/1955  . Influenza Split 05/15/2012  . Influenza, High Dose Seasonal PF 06/23/2015, 05/08/2017  . Influenza,inj,Quad PF,6+ Mos 04/24/2018  . Influenza-Unspecified 06/06/2016  . Tdap 12/17/2013    Qualifies for Shingles Vaccine?Yes  Screening Tests Health Maintenance  Topic Date Due  . COLONOSCOPY  06/29/2017  . PNA vac Low Risk Adult (1 of 2 - PCV13) 04/25/2019 (Originally 08/22/2012)  . DEXA SCAN  07/01/2018  . MAMMOGRAM  12/23/2019  . TETANUS/TDAP  12/18/2023  . INFLUENZA VACCINE  Completed  . Hepatitis C Screening  Completed    Cancer  Screenings: Lung: Low Dose CT Chest recommended if Age 19-80 years, 30 pack-year currently smoking OR have quit w/in 15years. Patient does not qualify. Breast:  Up to date on Mammogram? Yes   Up to date of Bone Density/Dexa? Yes Colorectal: Needs up to date screening. She is willing to do colonscopy.   Additional Screenings:  Hepatitis C Screening:      Plan:   Medicare Wellness Exam   I have personally reviewed and noted the following in  the patient's chart:   . Medical and social history . Use of alcohol, tobacco or illicit drugs  . Current medications and supplements . Functional ability and status . Nutritional status . Physical activity she participates in silver sneakers every day.  . Advanced directives . List of other physicians . Hospitalizations, surgeries, and ER visits in previous 12 months . Vitals . Screenings to include cognitive, depression, and falls - negative.  . Referrals and appointments . Flu vaccine given today. Declined Pneumonia vaccines . Bilat knee pain - can gets xray is pain returns  In addition, I have reviewed and discussed with patient certain preventive protocols, quality metrics, and best practice recommendations. A written personalized care plan for preventive services as well as general preventive health recommendations were provided to patient.     Nani Gasser, MD  04/24/2018

## 2018-04-24 NOTE — Patient Instructions (Addendum)
Keep up the exercise.  We will schedule your colonoscopy.    Colonoscopy, Adult A colonoscopy is an exam to look at the entire large intestine. During the exam, a lubricated, bendable tube is inserted into the anus and then passed into the rectum, colon, and other parts of the large intestine. A colonoscopy is often done as a part of normal colorectal screening or in response to certain symptoms, such as anemia, persistent diarrhea, abdominal pain, and blood in the stool. The exam can help screen for and diagnose medical problems, including:  Tumors.  Polyps.  Inflammation.  Areas of bleeding.  Tell a health care provider about:  Any allergies you have.  All medicines you are taking, including vitamins, herbs, eye drops, creams, and over-the-counter medicines.  Any problems you or family members have had with anesthetic medicines.  Any blood disorders you have.  Any surgeries you have had.  Any medical conditions you have.  Any problems you have had passing stool. What are the risks? Generally, this is a safe procedure. However, problems may occur, including:  Bleeding.  A tear in the intestine.  A reaction to medicines given during the exam.  Infection (rare).  What happens before the procedure? Eating and drinking restrictions Follow instructions from your health care provider about eating and drinking, which may include:  A few days before the procedure - follow a low-fiber diet. Avoid nuts, seeds, dried fruit, raw fruits, and vegetables.  1-3 days before the procedure - follow a clear liquid diet. Drink only clear liquids, such as clear broth or bouillon, black coffee or tea, clear juice, clear soft drinks or sports drinks, gelatin dessert, and popsicles. Avoid any liquids that contain red or purple dye.  On the day of the procedure - do not eat or drink anything during the 2 hours before the procedure, or within the time period that your health care provider  recommends.  Bowel prep If you were prescribed an oral bowel prep to clean out your colon:  Take it as told by your health care provider. Starting the day before your procedure, you will need to drink a large amount of medicated liquid. The liquid will cause you to have multiple loose stools until your stool is almost clear or light green.  If your skin or anus gets irritated from diarrhea, you may use these to relieve the irritation: ? Medicated wipes, such as adult wet wipes with aloe and vitamin E. ? A skin soothing-product like petroleum jelly.  If you vomit while drinking the bowel prep, take a break for up to 60 minutes and then begin the bowel prep again. If vomiting continues and you cannot take the bowel prep without vomiting, call your health care provider.  General instructions  Ask your health care provider about changing or stopping your regular medicines. This is especially important if you are taking diabetes medicines or blood thinners.  Plan to have someone take you home from the hospital or clinic. What happens during the procedure?  An IV tube may be inserted into one of your veins.  You will be given medicine to help you relax (sedative).  To reduce your risk of infection: ? Your health care team will wash or sanitize their hands. ? Your anal area will be washed with soap.  You will be asked to lie on your side with your knees bent.  Your health care provider will lubricate a long, thin, flexible tube. The tube will have a camera and  a light on the end.  The tube will be inserted into your anus.  The tube will be gently eased through your rectum and colon.  Air will be delivered into your colon to keep it open. You may feel some pressure or cramping.  The camera will be used to take images during the procedure.  A small tissue sample may be removed from your body to be examined under a microscope (biopsy). If any potential problems are found, the tissue  will be sent to a lab for testing.  If small polyps are found, your health care provider may remove them and have them checked for cancer cells.  The tube that was inserted into your anus will be slowly removed. The procedure may vary among health care providers and hospitals. What happens after the procedure?  Your blood pressure, heart rate, breathing rate, and blood oxygen level will be monitored until the medicines you were given have worn off.  Do not drive for 24 hours after the exam.  You may have a small amount of blood in your stool.  You may pass gas and have mild abdominal cramping or bloating due to the air that was used to inflate your colon during the exam.  It is up to you to get the results of your procedure. Ask your health care provider, or the department performing the procedure, when your results will be ready. This information is not intended to replace advice given to you by your health care provider. Make sure you discuss any questions you have with your health care provider. Document Released: 08/12/2000 Document Revised: 06/15/2016 Document Reviewed: 10/27/2015 Elsevier Interactive Patient Education  2018 [employer].

## 2018-05-23 ENCOUNTER — Telehealth: Payer: Self-pay | Admitting: Family Medicine

## 2018-05-23 NOTE — Telephone Encounter (Signed)
This was done when she came in for her last office visit. She was referred to [facility] endoscopy.Heath Gold, CMA

## 2018-05-23 NOTE — Telephone Encounter (Signed)
Left patient vm Thanks!

## 2018-05-23 NOTE — Telephone Encounter (Signed)
Pt called to get a referral for colonoscopy. Thanks!

## 2018-05-24 NOTE — Telephone Encounter (Signed)
Arline Asp can you check on this?

## 2018-05-25 ENCOUNTER — Encounter: Payer: Self-pay | Admitting: Gastroenterology

## 2018-06-01 ENCOUNTER — Ambulatory Visit (INDEPENDENT_AMBULATORY_CARE_PROVIDER_SITE_OTHER): Payer: Medicare Other

## 2018-06-01 DIAGNOSIS — M170 Bilateral primary osteoarthritis of knee: Secondary | ICD-10-CM

## 2018-06-01 DIAGNOSIS — M25561 Pain in right knee: Secondary | ICD-10-CM

## 2018-06-01 DIAGNOSIS — M25562 Pain in left knee: Principal | ICD-10-CM

## 2018-06-14 ENCOUNTER — Ambulatory Visit (INDEPENDENT_AMBULATORY_CARE_PROVIDER_SITE_OTHER): Payer: Medicare Other | Admitting: Family Medicine

## 2018-06-14 ENCOUNTER — Encounter: Payer: Self-pay | Admitting: Family Medicine

## 2018-06-14 VITALS — BP 157/68 | HR 59 | Ht 62.0 in | Wt 185.0 lb

## 2018-06-14 DIAGNOSIS — M1710 Unilateral primary osteoarthritis, unspecified knee: Secondary | ICD-10-CM | POA: Insufficient documentation

## 2018-06-14 DIAGNOSIS — M179 Osteoarthritis of knee, unspecified: Secondary | ICD-10-CM | POA: Insufficient documentation

## 2018-06-14 DIAGNOSIS — G8929 Other chronic pain: Secondary | ICD-10-CM | POA: Diagnosis not present

## 2018-06-14 DIAGNOSIS — M25562 Pain in left knee: Secondary | ICD-10-CM

## 2018-06-14 DIAGNOSIS — M170 Bilateral primary osteoarthritis of knee: Secondary | ICD-10-CM | POA: Diagnosis not present

## 2018-06-14 DIAGNOSIS — M8589 Other specified disorders of bone density and structure, multiple sites: Secondary | ICD-10-CM

## 2018-06-14 MED ORDER — DICLOFENAC SODIUM 1 % TD GEL: 4.0000 g | Freq: Four times a day (QID) | TRANSDERMAL | 11 refills | Status: AC

## 2018-06-14 NOTE — Patient Instructions (Addendum)
Thank you for coming in today. Work on the side leg raises.  Straight leg raises Toe rotated out straight leg raises.  30 reps 1-2x daily both sides.  If you have prioritize left side leg raises.   Continue to exercise.  Apply the diclofenac gel to the knee for pain up to 4x daily.  Recheck if not getting better.  May consider steroid injection.     Arthritis Arthritis is a term that is commonly used to refer to joint pain or joint disease. There are more than 100 types of arthritis. What are the causes? The most common cause of this condition is wear and tear of a joint. Other causes include:  Gout.  Inflammation of a joint.  An infection of a joint.  Sprains and other injuries near the joint.  A drug reaction or allergic reaction.  In some cases, the cause may not be known. What are the signs or symptoms? The main symptom of this condition is pain in the joint with movement. Other symptoms include:  Redness, swelling, or stiffness at a joint.  Warmth coming from the joint.  Fever.  Overall feeling of illness.  How is this diagnosed? This condition may be diagnosed with a physical exam and tests, including:  Blood tests.  Urine tests.  Imaging tests, such as MRI, X-rays, or a CT scan.  Sometimes, fluid is removed from a joint for testing. How is this treated? Treatment for this condition may involve:  Treatment of the cause, if it is known.  Rest.  Raising (elevating) the joint.  Applying cold or hot packs to the joint.  Medicines to improve symptoms and reduce inflammation.  Injections of a steroid such as cortisone into the joint to help reduce pain and inflammation.  Depending on the cause of your arthritis, you may need to make lifestyle changes to reduce stress on your joint. These changes may include exercising more and losing weight. Follow these instructions at home: Medicines  Take over-the-counter and prescription medicines only as told  by your health care provider.  Do not take aspirin to relieve pain if gout is suspected. Activity  Rest your joint if told by your health care provider. Rest is important when your disease is active and your joint feels painful, swollen, or stiff.  Avoid activities that make the pain worse. It is important to balance activity with rest.  Exercise your joint regularly with range-of-motion exercises as told by your health care provider. Try doing low-impact exercise, such as: ? Swimming. ? Water aerobics. ? Biking. ? Walking. Joint Care   If your joint is swollen, keep it elevated if told by your health care provider.  If your joint feels stiff in the morning, try taking a warm shower.  If directed, apply heat to the joint. If you have diabetes, do not apply heat without permission from your health care provider. ? Put a towel between the joint and the hot pack or heating pad. ? Leave the heat on the area for 20-30 minutes.  If directed, apply ice to the joint: ? Put ice in a plastic bag. ? Place a towel between your skin and the bag. ? Leave the ice on for 20 minutes, 2-3 times per day.  Keep all follow-up visits as told by your health care provider. This is important. Contact a health care provider if:  The pain gets worse.  You have a fever. Get help right away if:  You develop severe joint pain, swelling,  or redness.  Many joints become painful and swollen.  You develop severe back pain.  You develop severe weakness in your leg.  You cannot control your bladder or bowels. This information is not intended to replace advice given to you by your health care provider. Make sure you discuss any questions you have with your health care provider. Document Released: 09/22/2004 Document Revised: 01/21/2016 Document Reviewed: 11/10/2014 Elsevier Interactive Patient Education  [employer].

## 2018-06-14 NOTE — Progress Notes (Signed)
Claudia [name] is a 72 y.o. female who presents to [facility] today for bilateral knee pain.  Claudia [name] notes a 12-year history of mild chronic knee pain left worse than right.  She notes a month or 2 ago it started worsening in her left knee became quite bothersome.  She was seen by her primary care provider recently who ordered knee x-rays bilaterally.  X-rays show degenerative changes bilateral knees left lateral knee worse than right knee.  She has severe degenerative changes in the left lateral knee.  She was advised to start a quadriceps strengthening program using silver sneakers.  Fortunately over the last month or so she is been doing Silver sneakers and has increased her exercise.  She notes this has significantly improved her knee pain.  She denies any pain with standing and with activity and notes the pain is predominantly present when she stands from a seated position.  She notes the pain is located in the lateral knee with this motion.  She denies locking catching or giving way.  She rates the pain is mild to moderate.  She has not really tried much medications aside from occasional ibuprofen or Aleve which is not very helpful.  She does not want to take medications regularly for this pain if possible.    ROS:  As above  Exam:  BP (!) 157/68   Pulse (!) 59   Ht 5\' 2"  (1.575 m)   Wt 185 lb (83.9 kg)   BMI 33.84 kg/m  General: Well Developed, well nourished, and in no acute distress.  Neuro/Psych: Alert and oriented x3, extra-ocular muscles intact, able to move all 4 extremities, sensation grossly intact. Skin: Warm and dry, no rashes noted.  Respiratory: Not using accessory muscles, speaking in full sentences, trachea midline.  Cardiovascular: Pulses palpable, no extremity edema. Abdomen: Does not appear distended. MSK:  Left knee: Relatively normal-appearing with minimal effusion no erythema or deformity. Range of motion 0-120 degrees with  retropatellar crepitations. Intact ligamentous exam testing. Tender to palpation lateral joint line.  Nontender anterior knee. Knee extension and flexion strength are excellent Negative McMurray's testing.  Right knee relatively normal-appearing minimal effusion. Range of motion 0-120 degrees with moderate retropatellar crepitations. Not particularly tender to palpation.  Stable ligamentous exam testing.  Intact flexion and extension strength.  Hip motion is intact bilaterally. Left hip abduction strength diminished 4/5.   Lab and Radiology Results EXAM: RIGHT KNEE - COMPLETE 4+ VIEW; LEFT KNEE - COMPLETE 4+ VIEW  COMPARISON:  None.  FINDINGS: RIGHT knee: There is degenerative narrowing of the medial compartment, moderate in degree with associated mild osseous spurring. Lateral compartment appears relatively well preserved. There is advanced degenerative change at the patellofemoral compartment with near abutment of the patella and distal femur and prominent osteophyte formation.  No acute or suspicious osseous finding. No appreciable joint effusion.  LEFT knee: Advanced degenerative narrowing of the lateral compartment, with associated osteophyte formation. Milder degenerative change at the medial and patellofemoral compartments.  No acute or suspicious osseous finding.  IMPRESSION: 1. No acute findings, bilateral knees. 2. 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 There is also moderate narrowing of the medial RIGHT knee compartment suggesting underlying cartilage loss and/or meniscal derangement. 3. LEFT knee with advanced degenerative osteoarthritis at the lateral compartment.   Electronically Signed   By: Bary Richard M.D.   On: 06/01/2018 15:38 I personally (  independently) visualized and performed the interpretation of the images  attached in this note.      Assessment and Plan: 72 y.o. female with  Left knee pain improving with exercise worse with standing from a seated position.  Patient has significant degenerative changes on x-ray but fortunately has had significant symptom improvement with exercise program focus on quadricep strengthening.  This is excellent.  Claudia [name] is doing extremely well. I do not see the utility in doing an injection at this point as her pain is only situational.  Plan to use a trial of diclofenac gel work on hip abduction strengthening in addition to continued quadricep strengthening and recheck if not improving.  Next step would be trial of steroid injection.     No orders of the defined types were placed in this encounter.  Meds ordered this encounter  Medications  . diclofenac sodium (VOLTAREN) 1 % GEL    Sig: Apply 4 g topically 4 (four) times daily. To affected joint.    Dispense:  100 g    Refill:  11    Tried and failed ibuprofen and aleve. Knee OA    Historical information moved to improve visibility of documentation.  Past Medical History:  Diagnosis Date  . Essential hypertension   . Other and unspecified hyperlipidemia 11/08/2013   Past Surgical History:  Procedure Laterality Date  . CATARACT EXTRACTION    . ROTATOR CUFF REPAIR     right   Social History   Tobacco Use  . Smoking status: Never Smoker  . Smokeless tobacco: Never Used  Substance Use Topics  . Alcohol use: No   family history includes Hypertension in her mother; Stroke in her father.  Medications: Current Outpatient Medications  Medication Sig Dispense Refill  . AMBULATORY NON FORMULARY MEDICATION Take 2 capsules by mouth daily. Medication Name: Omax 3    . Calcium 600-200 MG-UNIT tablet Take 1 tablet by mouth 2 (two) times daily. 1 tablet 0  . hydrochlorothiazide (MICROZIDE) 12.5 MG capsule Take 1 capsule (12.5 mg total) by mouth daily. 90 capsule 1  . losartan (COZAAR) 50 MG tablet Take 1  tablet (50 mg total) by mouth daily. 90 tablet 1  . diclofenac sodium (VOLTAREN) 1 % GEL Apply 4 g topically 4 (four) times daily. To affected joint. 100 g 11   No current facility-administered medications for this visit.    Allergies  Allergen Reactions  . Lisinopril Cough      Discussed warning signs or symptoms. Please see discharge instructions. Patient expresses understanding.

## 2018-07-11 ENCOUNTER — Ambulatory Visit (AMBULATORY_SURGERY_CENTER): Payer: Medicare Other | Admitting: *Deleted

## 2018-07-11 VITALS — Ht 62.0 in | Wt 184.8 lb

## 2018-07-11 DIAGNOSIS — Z1211 Encounter for screening for malignant neoplasm of colon: Secondary | ICD-10-CM

## 2018-07-11 NOTE — Progress Notes (Signed)
No egg or soy allergy known to patient  No issues with past sedation with any surgeries  or procedures, no past  intubation  No diet pills per patient No home 02 use per patient  No blood thinners per patient  Pt denies issues with constipation  No A fib or A flutter  EMMI video sent to pt's e mail  Pt was in for PV this morning- she had a lot of questions- she read the consent form and stated she did not feel comfortable having a colonoscopy with the risk and complications that were listed on the consent-  She was concerned about perforation, aspiration, phlebitis, missed lesions, surgery if there was a perforation- she told me she wants to cancel the colon scheduled for 11-26 Tuesday and she will discuss with her PCP and may do the colo guard.  I cancelled her 11-26 colonoscopy. She states she has no risks for colon cancer and she was not comfortable

## 2018-07-23 ENCOUNTER — Encounter: Payer: Medicare Other | Admitting: Gastroenterology

## 2018-07-24 ENCOUNTER — Encounter: Payer: Medicare Other | Admitting: Gastroenterology

## 2018-08-07 ENCOUNTER — Ambulatory Visit: Payer: BC Managed Care – PPO | Admitting: Family Medicine

## 2018-08-28 ENCOUNTER — Telehealth: Payer: Self-pay

## 2018-08-28 DIAGNOSIS — R0989 Other specified symptoms and signs involving the circulatory and respiratory systems: Secondary | ICD-10-CM

## 2018-08-28 NOTE — Telephone Encounter (Signed)
Claudia HarmanRuth Nathanson was seen today by a Health Nurse with her insurance company. Mrs Les [place] [employer], did a PAD screening. The value of a normal screening is 1.00. She had 0.49 on the left, which is severe, and 0.75 on the right, which is moderate.

## 2018-08-30 NOTE — Telephone Encounter (Signed)
Patient advised and test ordered. 

## 2018-08-30 NOTE — Telephone Encounter (Signed)
OK lets schedule her for ABIs at vascular lab for formal assessment.  Please print and give to [facility] to schedule.

## 2018-08-31 ENCOUNTER — Ambulatory Visit (HOSPITAL_BASED_OUTPATIENT_CLINIC_OR_DEPARTMENT_OTHER)
Admission: RE | Admit: 2018-08-31 | Discharge: 2018-08-31 | Disposition: A | Discharge status: Home / Self Care | Payer: Medicare Other | Source: Ambulatory Visit | Attending: Family Medicine | Admitting: Family Medicine

## 2018-08-31 DIAGNOSIS — R0989 Other specified symptoms and signs involving the circulatory and respiratory systems: Secondary | ICD-10-CM

## 2018-09-03 LAB — FECAL OCCULT BLOOD, IMMUNOCHEMICAL: IFOBT: NEGATIVE

## 2018-09-03 LAB — HM COLONOSCOPY

## 2018-10-24 ENCOUNTER — Ambulatory Visit: Payer: Medicare Other | Admitting: Family Medicine

## 2018-10-26 ENCOUNTER — Other Ambulatory Visit: Payer: Self-pay | Admitting: Family Medicine

## 2018-10-26 MED ORDER — HYDROCHLOROTHIAZIDE 12.5 MG PO CAPS: 12.5000 mg | ORAL_CAPSULE | Freq: Every day | ORAL | 0 refills | Status: DC

## 2018-10-26 MED ORDER — LOSARTAN POTASSIUM 50 MG PO TABS: 50.0000 mg | ORAL_TABLET | Freq: Every day | ORAL | 0 refills | Status: DC

## 2018-11-28 ENCOUNTER — Telehealth: Payer: Self-pay

## 2018-11-28 NOTE — Telephone Encounter (Signed)
Patient called upset because she received a bill from [place] that they were not going to cover her ABIs without authorization. Ma[place] was asking if we had information to offer for this.   I advised patient that although we ordered the test, it was recommended by her insurance company who sent out a home nurse. I advised patient to reach out to home nurse who evaluated patient to see if they can provide her with copies of their notes to help with her appeal. Advised pt that if Rosann Auerbach needs information from [place] as to why this was ordered, find out exactly what information is needed/have [employer] send [place] a fax of what is needed so that we may assist in this.

## 2019-02-04 ENCOUNTER — Other Ambulatory Visit: Payer: Self-pay | Admitting: Family Medicine

## 2019-02-21 ENCOUNTER — Encounter: Payer: Self-pay | Admitting: Family Medicine

## 2019-03-07 ENCOUNTER — Encounter: Payer: Self-pay | Admitting: Family Medicine

## 2019-05-13 ENCOUNTER — Other Ambulatory Visit: Payer: Self-pay | Admitting: Family Medicine

## 2019-05-15 ENCOUNTER — Other Ambulatory Visit: Payer: Self-pay | Admitting: *Deleted

## 2019-05-15 MED ORDER — LOSARTAN POTASSIUM-HCTZ 50-12.5 MG PO TABS: 1.0000 | ORAL_TABLET | Freq: Every day | ORAL | 0 refills | Status: AC

## 2020-06-09 IMAGING — DX DG KNEE COMPLETE 4+V*L*
3 series · 3 of 3 positions shown · non-contrast
Comparison: None.

CLINICAL DATA: Pt with acute pain in both knees for over 1 yr. No
hx surg. No injury.

EXAM:
RIGHT KNEE - COMPLETE 4+ VIEW; LEFT KNEE - COMPLETE 4+ VIEW

[tunnel]
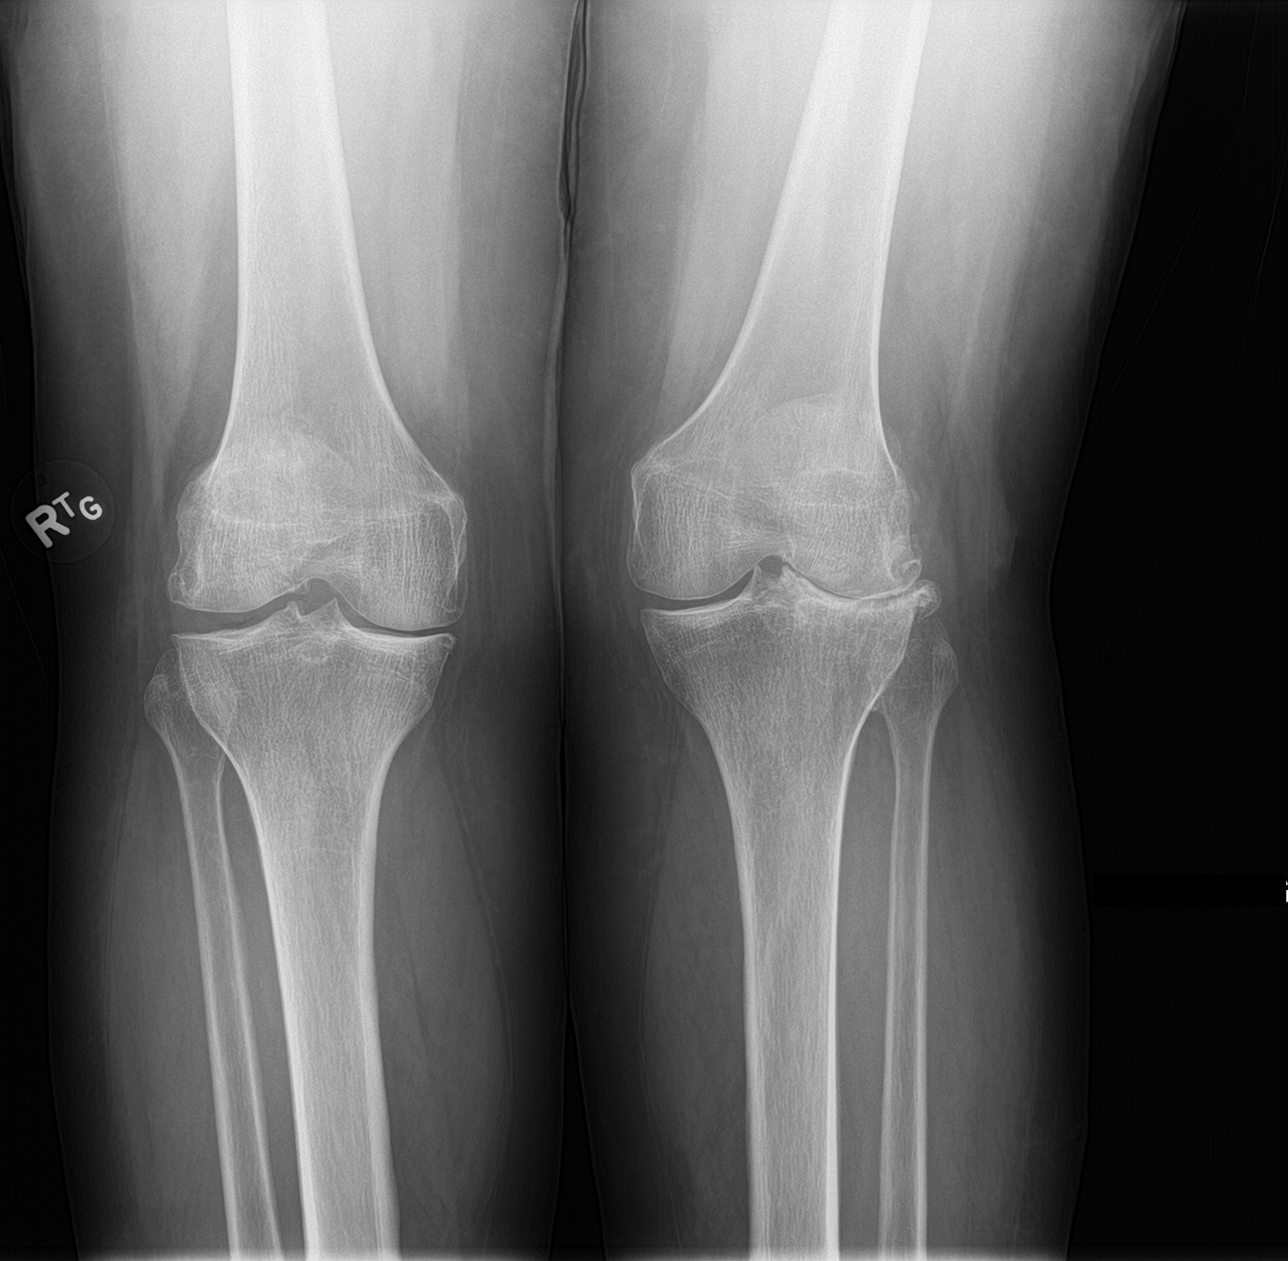

[knee lat]
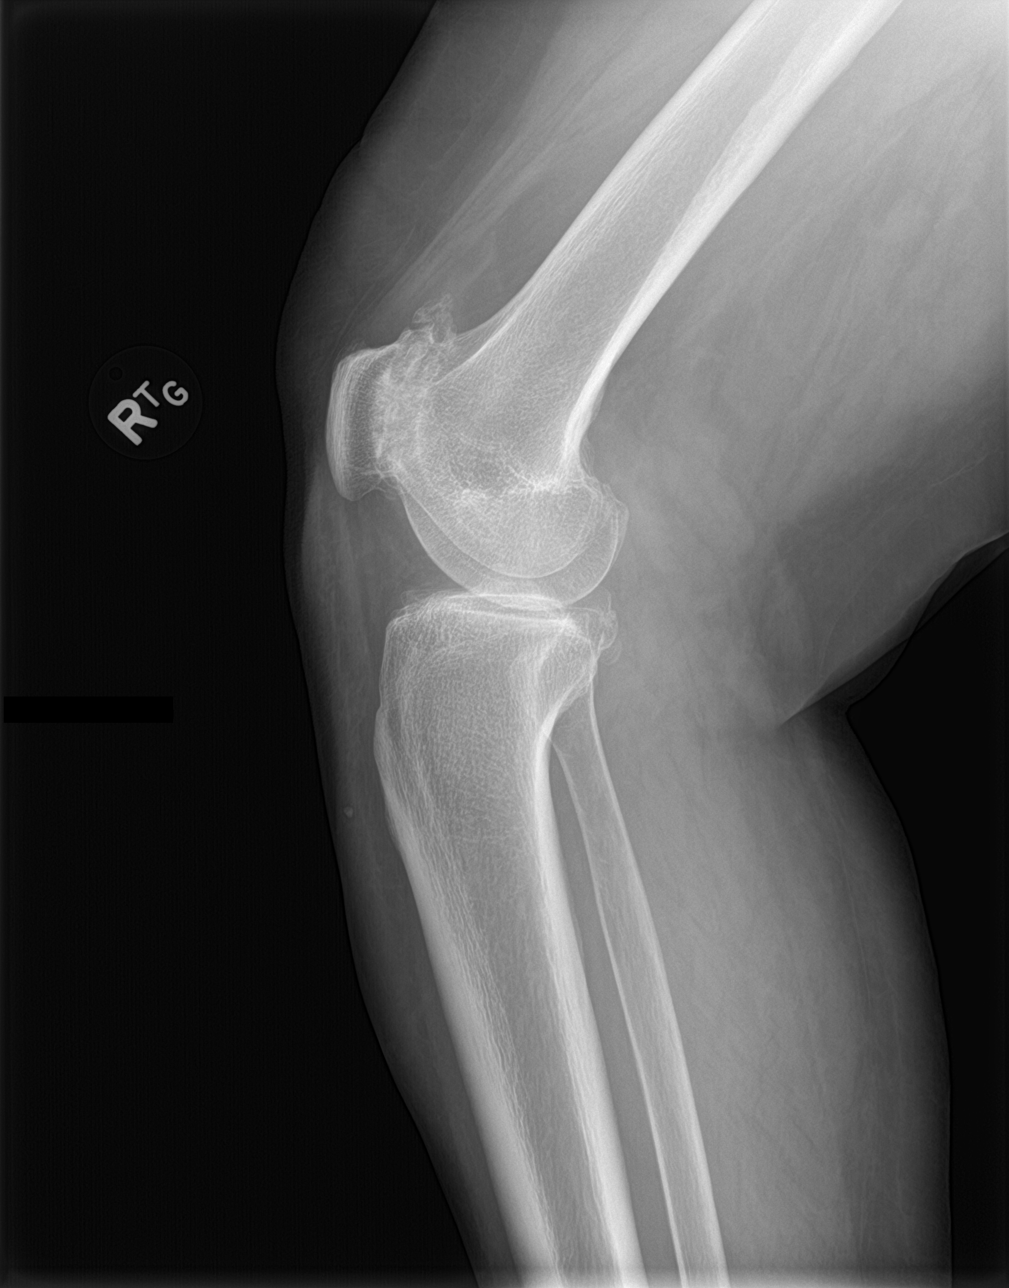

[knee sunrise]
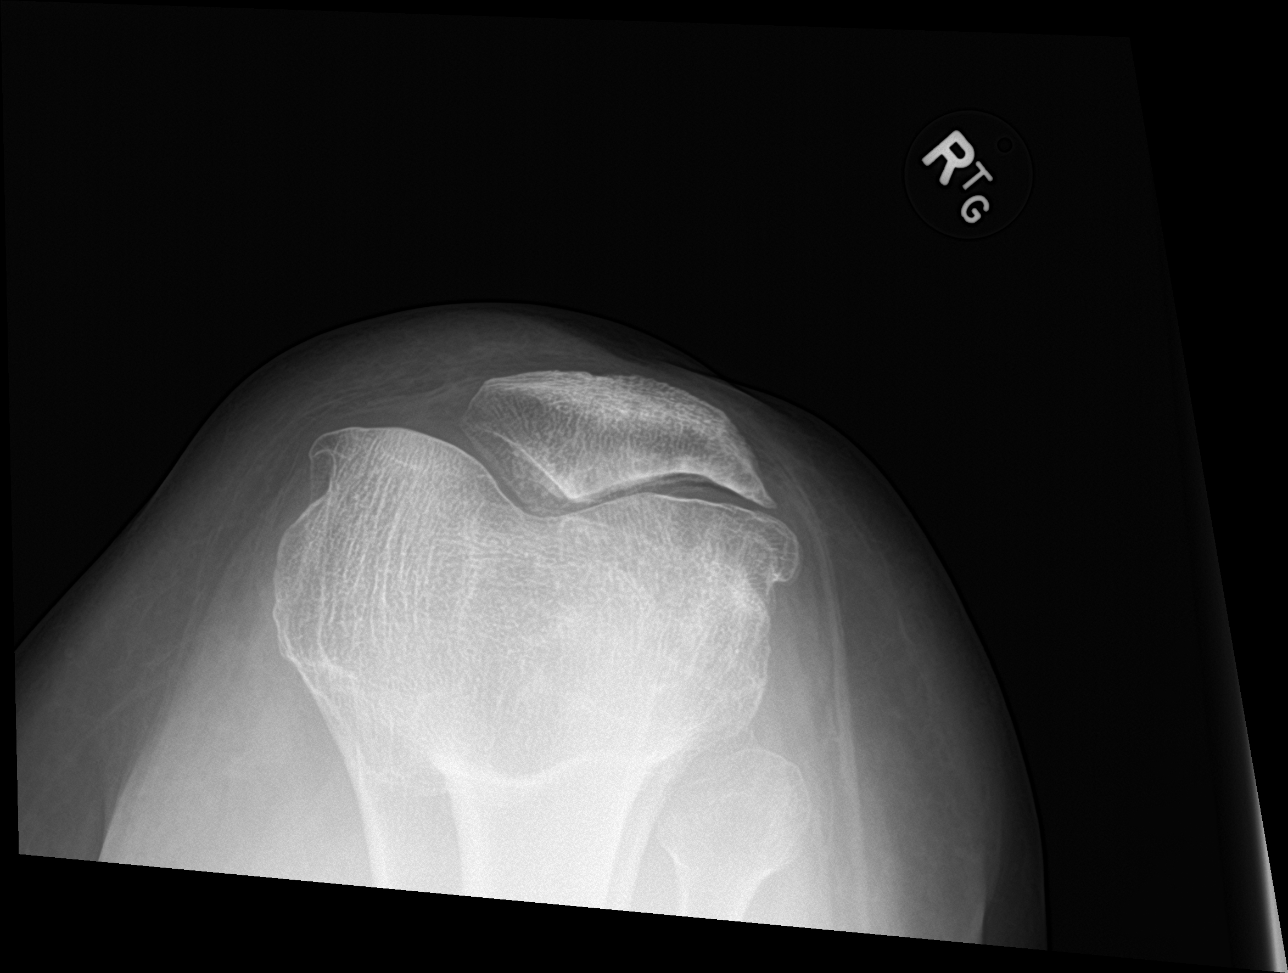

[3 of 3 positions shown; findings below may reference images not displayed]

FINDINGS: RIGHT knee: There is degenerative narrowing of the medial
compartment, moderate in degree with associated mild osseous
spurring. Lateral compartment appears relatively well preserved.
There is advanced degenerative change at the patellofemoral
compartment with near abutment of the patella and distal femur and
prominent osteophyte formation.

No acute or suspicious osseous finding. No appreciable joint
effusion.

LEFT knee: Advanced degenerative narrowing of the lateral
compartment, with associated osteophyte formation. Milder
degenerative change at the medial and patellofemoral compartments.

No acute or suspicious osseous finding.
IMPRESSION: 1. No acute findings, bilateral knees.
2. RIGHT knee with advanced degenerative osteoarthritis at the
patellofemoral compartment with near abutment of the patella and
associated osteophyte formation. There is also moderate narrowing of
the medial RIGHT knee compartment suggesting underlying cartilage
loss and/or meniscal derangement.
3. LEFT knee with advanced degenerative osteoarthritis at the
lateral compartment.

## 2023-04-09 ENCOUNTER — Ambulatory Visit (INDEPENDENT_AMBULATORY_CARE_PROVIDER_SITE_OTHER): Payer: Medicare (Managed Care)

## 2023-04-09 ENCOUNTER — Other Ambulatory Visit: Payer: Medicare (Managed Care)

## 2023-04-09 ENCOUNTER — Ambulatory Visit
Admission: EM | Admit: 2023-04-09 | Discharge: 2023-04-09 | Disposition: A | Discharge status: Home / Self Care | Payer: Medicare (Managed Care) | Attending: Internal Medicine | Admitting: Internal Medicine

## 2023-04-09 DIAGNOSIS — S52571A Other intraarticular fracture of lower end of right radius, initial encounter for closed fracture: Secondary | ICD-10-CM

## 2023-04-09 DIAGNOSIS — M25531 Pain in right wrist: Secondary | ICD-10-CM

## 2023-04-09 DIAGNOSIS — I10 Essential (primary) hypertension: Secondary | ICD-10-CM

## 2023-04-09 NOTE — Discharge Instructions (Addendum)
Your x-rays showed that your right wrist is broken. You were placed in a splint at today's visit. It is very important that you wear the splint daily until you are seen by Orthopedics.   Please wear the splint at all times. Make sure you contact Orthopedics for an urgent follow up to manage your fracture. I have attached the contact information for one here in town. Use Tylenol/Ibuprofen for pain and inflammation.   If new or worsening symptoms such as increased pain, increased swelling, or color changes develop, it is recommended that you go directly to the ER.

## 2023-04-09 NOTE — ED Provider Notes (Signed)
[facility]  Note:  This document was prepared using Dragon voice recognition software and may include unintentional dictation errors.  MRN: [id number] DOB: 25-Aug-1946 DATE: 04/09/23   Subjective:  Chief Complaint:  Chief Complaint  Patient presents with   Wrist Pain    HPI: Claudia [name] is a 77 y.o. female presenting for right wrist pain after a fall this morning. Patient states she was walking on the trail around her house when she looked back to see where her husband was and tripped. She states she used her right hand to reach behind her and catch herself. No prior injuries or surgeries to that right wrist. Patient is right hand dominate. She has not taken anything for the pain reporting coming to the clinic right after her fall. Pain worse with gripping/grasping, flexion. Of note, patient has a history of osteopenia. Denies fever, nausea/vomiting, hitting her head, LOC. Endorses right wrist pain. Presents NAD.  Prior to Admission medications   Medication Sig Start Date End Date Taking? Authorizing Provider  ferrous sulfate 325 (65 FE) MG tablet Take 1 tablet by mouth daily with breakfast. 11/12/14  Yes [provider]  pravastatin (PRAVACHOL) 20 MG tablet Take by mouth. 03/01/23  Yes [provider]  AMBULATORY NON FORMULARY MEDICATION Take 2 capsules by mouth daily. Medication Name: Omax 3    [provider]  Calcium 600-200 MG-UNIT tablet Take 1 tablet by mouth 2 (two) times daily. Patient not taking: Reported on 07/11/2018 07/01/16   Agapito Games, MD  diclofenac sodium (VOLTAREN) 1 % GEL Apply 4 g topically 4 (four) times daily. To affected joint. Patient not taking: Reported on 07/11/2018 06/14/18   Rodolph Bong, MD  losartan-hydrochlorothiazide [facility]) 50-12.5 MG tablet Take 1 tablet by mouth daily. 30 DAY SUPPLY GIVEN. MUST SCHEDULE/KEEP APPOINTMENT FOR REFILLS 05/15/19   Agapito Games, MD     Allergies  Allergen Reactions    Lisinopril Cough    cough    History:   Past Medical History:  Diagnosis Date   Anemia    borderline    Arthritis    knees   Cataract    removed bilaterally   Essential hypertension    Osteoporosis    borderline per pt- on calcium    Other and unspecified hyperlipidemia 11/08/2013     Past Surgical History:  Procedure Laterality Date   CATARACT EXTRACTION     COLONOSCOPY     POLYPECTOMY     ROTATOR CUFF REPAIR     right    Family History  Problem Relation Age of Onset   Hypertension Mother    Stroke Father    Colon cancer Neg Hx    Colon polyps Neg Hx    Esophageal cancer Neg Hx    Rectal cancer Neg Hx    Stomach cancer Neg Hx     Social History   Tobacco Use   Smoking status: Never   Smokeless tobacco: Never  Substance Use Topics   Alcohol use: No   Drug use: No    Review of Systems  Constitutional:  Negative for fever.  Gastrointestinal:  Negative for nausea and vomiting.  Musculoskeletal:  Positive for arthralgias.  Neurological:  Negative for syncope and numbness.     Objective:   Vitals: BP (!) 172/80 (BP Location: Right Arm)   Pulse 80   Temp 97.7 F (36.5 C) (Oral)   Resp 18   SpO2 96%   Physical Exam Constitutional:  General: She is not in acute distress.    Appearance: Normal appearance. She is well-developed and overweight. She is not ill-appearing or toxic-appearing.  HENT:     Head: Normocephalic and atraumatic.  Cardiovascular:     Rate and Rhythm: Normal rate and regular rhythm.     Pulses:          Carotid pulses are 2+ on the right side and 2+ on the left side.    Heart sounds: Normal heart sounds.  Pulmonary:     Effort: Pulmonary effort is normal.     Breath sounds: Normal breath sounds.     Comments: Clear to auscultation bilaterally  Abdominal:     General: Bowel sounds are normal.     Palpations: Abdomen is soft.     Tenderness: There is no abdominal tenderness.  Musculoskeletal:     Right wrist:  Tenderness present. Decreased range of motion. Normal pulse.     Left wrist: Normal.     Comments: Decreased ROM in R wrist due to pain with gripping, grasping, flexion. TTP along R distal radius. No warmth, erythema, or discharge. NV intact.  Skin:    General: Skin is warm and dry.  Neurological:     General: No focal deficit present.     Mental Status: She is alert.  Psychiatric:        Mood and Affect: Mood and affect normal.     Results:  Labs: No results found for this or any previous visit (from the past 24 hour(s)).  Radiology: DG Wrist Complete Right  Result Date: 04/09/2023 CLINICAL DATA:  right wrist pain, history of fall EXAM: RIGHT WRIST - COMPLETE 3+ VIEW COMPARISON:  None Available. FINDINGS: There is a focal cortical interruption in the distal radius involving subchondral cortex without step-off deformity, seen only on the AP projection without any confirmatory evidence of fracture on the other views. Normal angulation of the distal radial articular surface. Carpal rows intact. Normal alignment and mineralization. IMPRESSION: Possible nondisplaced  intra-articular fracture distal radius. Electronically Signed   By: Corlis Leak M.D.   On: 04/09/2023 09:33     UC Course/Treatments:  Procedures: Splint Application  Date/Time: 04/09/2023 9:49 AM  Performed by: Cynda Acres, PA-C Authorized by: Merrilee Jansky, MD   Consent:    Consent obtained:  Verbal   Consent given by:  Patient   Risks, benefits, and alternatives were discussed: yes     Risks discussed:  Pain   Alternatives discussed:  Alternative treatment Pre-procedure details:    Distal neurologic exam:  Normal   Distal perfusion: distal pulses strong and brisk capillary refill   Procedure details:    Location:  Wrist   Wrist location:  R wrist   Cast type:  Short arm   Splint type:  Sugar tong   Supplies:  Fiberglass Post-procedure details:    Distal neurologic exam:  Normal   Distal perfusion:  brisk capillary refill     Procedure completion:  Tolerated well, no immediate complications    Medications Ordered in UC: Medications - No data to display   Assessment and Plan :     ICD-10-CM   1. Other closed intra-articular fracture of distal end of right radius, initial encounter  S52.571A     2. Acute pain of right wrist  M25.531     3. Essential hypertension  I10      Other closed intra-articular fracture of distal end of right radius Afebrile, nontoxic-appearing, NAD. VSS.  DDX includes but not limited to: fracture, contusion, sprain Imaging showed possible nondisplaced  intra-articular fracture distal radius. Given mechanism of injury and presentation today in office, will treat as a fracture. Sugar tong splint was applied today in office and sling was given. Recommend OTC analgesics as needed for pain as well as recommend patient follow RICE regimen. Patient instructed to follow up with orthopedics next week. Strict ED precautions were given and patient verbalized understanding.  Acute pain of right wrist Afebrile, nontoxic-appearing, NAD. VSS. DDX includes but not limited to: fracture, contusion, sprain Imaging showed possible nondisplaced  intra-articular fracture distal radius. Given mechanism of injury and presentation today in office, will treat as a fracture. Sugar tong splint was applied today in office and sling was given. Recommend OTC analgesics as needed for pain as well as recommend patient follow RICE regimen. Patient instructed to follow up with orthopedics next week. Strict ED precautions were given and patient verbalized understanding.  Essential Hypertension Afebrile, nontoxic-appearing, NAD. VSS. Previously diagnosed Active.  Patient reports to not taking her medication yet today. Based on prior visits, appears to be well controlled. Suspect elevated secondary to pain and not taking her medication yet today. Recommend she monitor BP daily and follow up with  PCP if persistently <140/90. Strict ED precautions were given and patient verbalized understanding.  ED Discharge Orders     None        I have reviewed the PDMP during this encounter.     Cynda Acres, PA-C 04/09/23 1610

## 2023-04-09 NOTE — ED Triage Notes (Signed)
Pt fell this am while walking and c/o of right wrist pain.
